# Patient Record
Sex: Female | Born: 1950 | Race: White | Hispanic: No | Marital: Single | Smoking: Former smoker
Health system: Southern US, Community
[De-identification: ages and names within clinical notes are randomized; demographics above are authoritative.]

## PROBLEM LIST (undated history)

## (undated) DIAGNOSIS — F32A Depression, unspecified: Secondary | ICD-10-CM

## (undated) DIAGNOSIS — M419 Scoliosis, unspecified: Secondary | ICD-10-CM

## (undated) DIAGNOSIS — F329 Major depressive disorder, single episode, unspecified: Secondary | ICD-10-CM

## (undated) DIAGNOSIS — J449 Chronic obstructive pulmonary disease, unspecified: Secondary | ICD-10-CM

## (undated) DIAGNOSIS — F419 Anxiety disorder, unspecified: Secondary | ICD-10-CM

## (undated) DIAGNOSIS — I10 Essential (primary) hypertension: Secondary | ICD-10-CM

---

## 2006-12-09 HISTORY — PX: TOTAL HIP ARTHROPLASTY: SHX124

## 2008-12-09 HISTORY — PX: BACK SURGERY: SHX140

## 2010-12-09 HISTORY — PX: TOTAL HIP ARTHROPLASTY: SHX124

## 2011-03-07 ENCOUNTER — Encounter (HOSPITAL_COMMUNITY)
Admission: RE | Admit: 2011-03-07 | Discharge: 2011-03-07 | Disposition: A | Discharge status: Home / Self Care | Payer: Managed Care, Other (non HMO) | Source: Ambulatory Visit | Attending: Neurological Surgery | Admitting: Neurological Surgery

## 2011-03-07 DIAGNOSIS — Z01812 Encounter for preprocedural laboratory examination: Secondary | ICD-10-CM | POA: Insufficient documentation

## 2011-03-07 DIAGNOSIS — Z01810 Encounter for preprocedural cardiovascular examination: Secondary | ICD-10-CM | POA: Insufficient documentation

## 2011-03-07 LAB — DIFFERENTIAL
Basophils Absolute: 0 10*3/uL (ref 0.0–0.1)
Basophils Relative: 0 % (ref 0–1)
Eosinophils Absolute: 0.1 10*3/uL (ref 0.0–0.7)
Monocytes Relative: 6 % (ref 3–12)
Neutro Abs: 6.5 10*3/uL (ref 1.7–7.7)
Neutrophils Relative %: 74 % (ref 43–77)

## 2011-03-07 LAB — CBC
Hemoglobin: 12.8 g/dL (ref 12.0–15.0)
MCH: 30.2 pg (ref 26.0–34.0)
Platelets: 344 10*3/uL (ref 150–400)
RBC: 4.24 MIL/uL (ref 3.87–5.11)
WBC: 8.9 10*3/uL (ref 4.0–10.5)

## 2011-03-07 LAB — BASIC METABOLIC PANEL
CO2: 30 mEq/L (ref 19–32)
Calcium: 9.6 mg/dL (ref 8.4–10.5)
Creatinine, Ser: 0.82 mg/dL (ref 0.4–1.2)
GFR calc Af Amer: >60 mL/min (ref >60)
GFR calc non Af Amer: >60 mL/min (ref >60)
Sodium: 140 mEq/L (ref 135–145)

## 2011-03-07 LAB — PROTIME-INR
INR: 0.75 (ref 0.00–1.49)
Prothrombin Time: 10.7 seconds — ABNORMAL LOW (ref 11.6–15.2)

## 2011-03-07 LAB — APTT: aPTT: 26 seconds (ref 24–37)

## 2011-03-07 LAB — SURGICAL PCR SCREEN: MRSA, PCR: NEGATIVE

## 2011-03-13 ENCOUNTER — Ambulatory Visit (HOSPITAL_COMMUNITY): Payer: Managed Care, Other (non HMO)

## 2011-03-13 ENCOUNTER — Observation Stay (HOSPITAL_COMMUNITY)
Admission: RE | Admit: 2011-03-13 | Discharge: 2011-03-14 | Disposition: A | Discharge status: Home / Self Care | Payer: Managed Care, Other (non HMO) | Source: Ambulatory Visit | Attending: Neurological Surgery | Admitting: Neurological Surgery

## 2011-03-13 DIAGNOSIS — Z01810 Encounter for preprocedural cardiovascular examination: Secondary | ICD-10-CM | POA: Insufficient documentation

## 2011-03-13 DIAGNOSIS — I10 Essential (primary) hypertension: Secondary | ICD-10-CM | POA: Insufficient documentation

## 2011-03-13 DIAGNOSIS — J4489 Other specified chronic obstructive pulmonary disease: Secondary | ICD-10-CM | POA: Insufficient documentation

## 2011-03-13 DIAGNOSIS — F17200 Nicotine dependence, unspecified, uncomplicated: Secondary | ICD-10-CM | POA: Insufficient documentation

## 2011-03-13 DIAGNOSIS — J449 Chronic obstructive pulmonary disease, unspecified: Secondary | ICD-10-CM | POA: Insufficient documentation

## 2011-03-13 DIAGNOSIS — M5126 Other intervertebral disc displacement, lumbar region: Principal | ICD-10-CM | POA: Insufficient documentation

## 2011-03-13 DIAGNOSIS — M47817 Spondylosis without myelopathy or radiculopathy, lumbosacral region: Secondary | ICD-10-CM | POA: Insufficient documentation

## 2011-03-13 DIAGNOSIS — Z01812 Encounter for preprocedural laboratory examination: Secondary | ICD-10-CM | POA: Insufficient documentation

## 2011-03-13 DIAGNOSIS — F411 Generalized anxiety disorder: Secondary | ICD-10-CM | POA: Insufficient documentation

## 2011-03-25 NOTE — Op Note (Signed)
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  POSTOPERATIVE DIAGNOSES:  Left L4-5 spondylosis with stenosis and disk herniation with left leg pain.  PROCEDURES:  Decompressive lumbar hemilaminectomy, medial facetectomy with foraminotomy at L4-5 for decompression of L5 nerve root followed by microdiskectomy utilizing microscopic dissection.  SURGEON:  Tia Alert, MD  ASSISTANT:  Donalee Citrin, MD  ANESTHESIA:  General endotracheal.  COMPLICATIONS:  None apparent.  INDICATIONS FOR PROCEDURE:  Ms. Nigh is a 60 year old female who presented with severe left leg pain and actually responded to Lyrica quite nicely, but she started to have side effects from the Lyrica. When she was off the Lyrica, she had posterior leg pain on the left with numbness and tingling.  She had an MRI which showed severe spondylosis and scoliosis, but she had severe lateral recess stenosis at L4-5 on the left.  I recommended decompressive laminectomy with possible diskectomy. She understood the risks, benefits, and expected outcome and wished to proceed.  DESCRIPTION OF PROCEDURE:  The patient was taken to the operating room and after induction of adequate generalized endotracheal anesthesia, she was rolled into prone position on the Wilson frame.  All pressure points were padded.  Her lumbar region was prepped with DuraPrep and then draped in usual sterile fashion.  A 5 mL of local anesthesia injected and a small dorsal midline incision was made and carried down to the lumbosacral fascia.  The fascia was  opened on the left side and taken down in subperiosteal fashion to expose the L4-5.  Intraoperative x-ray confirmed my level and then I used a combination of high-speed drill and Kerrison punches to perform a hemilaminectomy, medial facetectomy, and foraminotomy at L5 on the left side.  The underlying yellow ligament was opened and removed in a piecemeal fashion to expose the underlying dura and L5 nerve root.  I dissected until I was distal to the pedicle at L5 on the left, followed the nerve root out distal to the pedicle.  I undercut the lateral recess as best as I could and try to save enough facets and not destabilize the segment.  I was able to retract the nerve root medially.  He was fairly scarred down to the disk complex.  I did find a knuckle of the disk herniation that was partially calcified sticking up under the nerve.  I was able to remove this with pituitary rongeur.  I was able to incise the disk space and perform a thorough intradiscal diskectomy.  Once the diskectomy was complete, I could pass a nerve hook easily through the midline and along the L5 nerve root on the left.  I felt like it was well decompressed.  I irrigated  with saline solution containing bacitracin, dried all bleeding points, lined the dura with Gelfoam and then closed the fascia with 0 Vicryl closing subcutaneous subcuticular tissue with 2-0 and 3-0 Vicryl, and closed the skin with benzoin and Steri-Strips.  The drapes were removed.  The sterile dressing was applied.  The patient was awakened from anesthesia and transferred to recovery room in stable condition.  At the end of the procedure, all sponge, needle, and instrument counts were correct.     Tia Alert, MD     DSJ/MEDQ  D:  03/13/2011  T:  03/14/2011  Job:  [id number]  Electronically Signed by Marikay Alar MD on 03/24/2011 12:58:12 PM

## 2011-12-10 HISTORY — PX: KNEE SURGERY: SHX244

## 2015-01-20 ENCOUNTER — Other Ambulatory Visit: Payer: Self-pay | Admitting: Neurological Surgery

## 2015-02-01 ENCOUNTER — Encounter (HOSPITAL_COMMUNITY)
Admission: RE | Admit: 2015-02-01 | Discharge: 2015-02-01 | Disposition: A | Discharge status: Home / Self Care | Payer: Managed Care, Other (non HMO) | Source: Ambulatory Visit | Attending: Neurological Surgery | Admitting: Neurological Surgery

## 2015-02-01 ENCOUNTER — Encounter (HOSPITAL_COMMUNITY): Payer: Self-pay

## 2015-02-01 DIAGNOSIS — Z96643 Presence of artificial hip joint, bilateral: Secondary | ICD-10-CM | POA: Diagnosis not present

## 2015-02-01 DIAGNOSIS — M4302 Spondylolysis, cervical region: Secondary | ICD-10-CM | POA: Diagnosis not present

## 2015-02-01 DIAGNOSIS — Z87891 Personal history of nicotine dependence: Secondary | ICD-10-CM | POA: Diagnosis not present

## 2015-02-01 DIAGNOSIS — M5022 Other cervical disc displacement, mid-cervical region: Secondary | ICD-10-CM | POA: Diagnosis not present

## 2015-02-01 DIAGNOSIS — J449 Chronic obstructive pulmonary disease, unspecified: Secondary | ICD-10-CM | POA: Diagnosis not present

## 2015-02-01 DIAGNOSIS — F329 Major depressive disorder, single episode, unspecified: Secondary | ICD-10-CM | POA: Diagnosis not present

## 2015-02-01 DIAGNOSIS — M5021 Other cervical disc displacement,  high cervical region: Secondary | ICD-10-CM | POA: Diagnosis not present

## 2015-02-01 DIAGNOSIS — I10 Essential (primary) hypertension: Secondary | ICD-10-CM | POA: Diagnosis not present

## 2015-02-01 DIAGNOSIS — F419 Anxiety disorder, unspecified: Secondary | ICD-10-CM | POA: Diagnosis not present

## 2015-02-01 DIAGNOSIS — Z79899 Other long term (current) drug therapy: Secondary | ICD-10-CM | POA: Diagnosis not present

## 2015-02-01 DIAGNOSIS — M4802 Spinal stenosis, cervical region: Secondary | ICD-10-CM

## 2015-02-01 HISTORY — DX: Chronic obstructive pulmonary disease, unspecified: J44.9

## 2015-02-01 HISTORY — DX: Scoliosis, unspecified: M41.9

## 2015-02-01 HISTORY — DX: Anxiety disorder, unspecified: F41.9

## 2015-02-01 HISTORY — DX: Essential (primary) hypertension: I10

## 2015-02-01 HISTORY — DX: Major depressive disorder, single episode, unspecified: F32.9

## 2015-02-01 HISTORY — DX: Depression, unspecified: F32.A

## 2015-02-01 LAB — CBC WITH DIFFERENTIAL/PLATELET
Basophils Absolute: 0 10*3/uL (ref 0.0–0.1)
Basophils Relative: 0 % (ref 0–1)
EOS PCT: 2 % (ref 0–5)
Eosinophils Absolute: 0.2 10*3/uL (ref 0.0–0.7)
HEMATOCRIT: 41.2 % (ref 36.0–46.0)
Hemoglobin: 14 g/dL (ref 12.0–15.0)
LYMPHS PCT: 26 % (ref 12–46)
Lymphs Abs: 1.8 10*3/uL (ref 0.7–4.0)
MCH: 30.8 pg (ref 26.0–34.0)
MCHC: 34 g/dL (ref 30.0–36.0)
MCV: 90.5 fL (ref 78.0–100.0)
MONO ABS: 0.5 10*3/uL (ref 0.1–1.0)
Monocytes Relative: 7 % (ref 3–12)
Neutro Abs: 4.5 10*3/uL (ref 1.7–7.7)
Neutrophils Relative %: 65 % (ref 43–77)
PLATELETS: 287 10*3/uL (ref 150–400)
RBC: 4.55 MIL/uL (ref 3.87–5.11)
RDW: 13.6 % (ref 11.5–15.5)
WBC: 7 10*3/uL (ref 4.0–10.5)

## 2015-02-01 LAB — BASIC METABOLIC PANEL
ANION GAP: 10 (ref 5–15)
BUN: 9 mg/dL (ref 6–23)
CALCIUM: 9.9 mg/dL (ref 8.4–10.5)
CHLORIDE: 99 mmol/L (ref 96–112)
CO2: 30 mmol/L (ref 19–32)
Creatinine, Ser: 0.94 mg/dL (ref 0.50–1.10)
GFR calc Af Amer: 74 mL/min — ABNORMAL LOW (ref >90)
GFR calc non Af Amer: 64 mL/min — ABNORMAL LOW (ref >90)
GLUCOSE: 102 mg/dL — AB (ref 70–99)
POTASSIUM: 3.6 mmol/L (ref 3.5–5.1)
SODIUM: 139 mmol/L (ref 135–145)

## 2015-02-01 LAB — PROTIME-INR
INR: 0.9 (ref 0.00–1.49)
PROTHROMBIN TIME: 12.3 s (ref 11.6–15.2)

## 2015-02-01 LAB — SURGICAL PCR SCREEN
MRSA, PCR: NEGATIVE
Staphylococcus aureus: NEGATIVE

## 2015-02-01 NOTE — Progress Notes (Addendum)
PCP: Dr.Daniel Judie GrieveBryan at [facility] in [place]  [phone]

## 2015-02-01 NOTE — Pre-Procedure Instructions (Signed)
Zella RicherLinda J Stander  02/01/2015    Your procedure is scheduled on:  Wednesday, March 2  [facility] "A" at 8:00 AM.  Call this number if you have problems the morning of surgery: [phone]               Any questions prior to surgery call pre-admissions [phone] (Monday-Friday 8:00am-4:00pm)   Remember:   Do not eat food or drink liquids after midnight.   Take these medicines the morning of surgery with A SIP OF WATER: spiriva             Stop vitamins,fish oil ( Tomorrow Feb. 25)   Do not wear jewelry, make-up or nail polish.  Do not wear lotions, powders, or perfumes. You may wear deodorant.  Do not shave 48 hours prior to surgery. Men may shave face and neck.  Do not bring valuables to the hospital.  [facility] is not responsible  for any belongings or valuables.               Contacts, dentures or bridgework may not be worn into surgery.  Leave suitcase in the car. After surgery it may be brought to your room.  For patients admitted to the hospital, discharge time is determined by your                treatment team.               Patients discharged the day of surgery will not be allowed to drive  home.  Name and phone number of your driver:   Special Instructions: review preparing for surgery handout   Please read over the following fact sheets that you were given: Pain Booklet, Coughing and Deep Breathing, MRSA Information and Surgical Site Infection Prevention

## 2015-02-07 MED ORDER — CEFAZOLIN SODIUM-DEXTROSE 2-3 GM-% IV SOLR
2.0000 g | INTRAVENOUS | Status: AC
  Administered 2015-02-08 (×2): 2 g via INTRAVENOUS
  Filled 2015-02-07: qty 50

## 2015-02-07 MED ORDER — DEXAMETHASONE SODIUM PHOSPHATE 10 MG/ML IJ SOLN
10.0000 mg | INTRAMUSCULAR | Status: AC
  Administered 2015-02-08: 10 mg via INTRAVENOUS
  Filled 2015-02-07: qty 1

## 2015-02-07 NOTE — Progress Notes (Signed)
Pt. States that she is aware of time change for surgery and knows to arrive at 0630..Marland Kitchen

## 2015-02-08 ENCOUNTER — Observation Stay (HOSPITAL_COMMUNITY)
Admission: RE | Admit: 2015-02-08 | Discharge: 2015-02-09 | Disposition: A | Discharge status: Home / Self Care | Payer: Managed Care, Other (non HMO) | Source: Ambulatory Visit | Attending: Neurological Surgery | Admitting: Neurological Surgery

## 2015-02-08 ENCOUNTER — Inpatient Hospital Stay (HOSPITAL_COMMUNITY): Payer: Managed Care, Other (non HMO)

## 2015-02-08 ENCOUNTER — Encounter (HOSPITAL_COMMUNITY): Payer: Self-pay | Admitting: Neurological Surgery

## 2015-02-08 ENCOUNTER — Inpatient Hospital Stay (HOSPITAL_COMMUNITY): Payer: Managed Care, Other (non HMO) | Admitting: Certified Registered"

## 2015-02-08 ENCOUNTER — Encounter (HOSPITAL_COMMUNITY)
Admission: RE | Disposition: A | Discharge status: Home / Self Care | Payer: Self-pay | Source: Ambulatory Visit | Attending: Neurological Surgery

## 2015-02-08 DIAGNOSIS — M4302 Spondylolysis, cervical region: Secondary | ICD-10-CM | POA: Diagnosis not present

## 2015-02-08 DIAGNOSIS — J449 Chronic obstructive pulmonary disease, unspecified: Secondary | ICD-10-CM | POA: Insufficient documentation

## 2015-02-08 DIAGNOSIS — Z87891 Personal history of nicotine dependence: Secondary | ICD-10-CM | POA: Insufficient documentation

## 2015-02-08 DIAGNOSIS — M5022 Other cervical disc displacement, mid-cervical region: Secondary | ICD-10-CM | POA: Insufficient documentation

## 2015-02-08 DIAGNOSIS — Z981 Arthrodesis status: Secondary | ICD-10-CM

## 2015-02-08 DIAGNOSIS — I10 Essential (primary) hypertension: Secondary | ICD-10-CM | POA: Insufficient documentation

## 2015-02-08 DIAGNOSIS — Z79899 Other long term (current) drug therapy: Secondary | ICD-10-CM | POA: Insufficient documentation

## 2015-02-08 DIAGNOSIS — M4322 Fusion of spine, cervical region: Secondary | ICD-10-CM

## 2015-02-08 DIAGNOSIS — Z96643 Presence of artificial hip joint, bilateral: Secondary | ICD-10-CM | POA: Insufficient documentation

## 2015-02-08 DIAGNOSIS — M5021 Other cervical disc displacement,  high cervical region: Secondary | ICD-10-CM | POA: Insufficient documentation

## 2015-02-08 DIAGNOSIS — F419 Anxiety disorder, unspecified: Secondary | ICD-10-CM | POA: Insufficient documentation

## 2015-02-08 DIAGNOSIS — F329 Major depressive disorder, single episode, unspecified: Secondary | ICD-10-CM | POA: Insufficient documentation

## 2015-02-08 HISTORY — PX: ANTERIOR CERVICAL DECOMPRESSION/DISCECTOMY FUSION 4 LEVELS: SHX5556

## 2015-02-08 SURGERY — ANTERIOR CERVICAL DECOMPRESSION/DISCECTOMY FUSION 4 LEVELS
Anesthesia: General | Site: Neck

## 2015-02-08 MED ORDER — PHENYLEPHRINE HCL 10 MG/ML IJ SOLN
INTRAMUSCULAR | Status: AC
  Filled 2015-02-08: qty 1

## 2015-02-08 MED ORDER — 0.9 % SODIUM CHLORIDE (POUR BTL) OPTIME
TOPICAL | Status: DC | PRN
  Administered 2015-02-08: 1000 mL

## 2015-02-08 MED ORDER — TIOTROPIUM BROMIDE MONOHYDRATE 18 MCG IN CAPS
18.0000 ug | ORAL_CAPSULE | Freq: Every day | RESPIRATORY_TRACT | Status: DC
  Filled 2015-02-08: qty 5

## 2015-02-08 MED ORDER — THROMBIN 5000 UNITS EX SOLR
OROMUCOSAL | Status: DC | PRN
  Administered 2015-02-08 (×2): 10 mL via TOPICAL

## 2015-02-08 MED ORDER — ALBUMIN HUMAN 5 % IV SOLN
INTRAVENOUS | Status: DC | PRN
  Administered 2015-02-08: 09:00:00 via INTRAVENOUS

## 2015-02-08 MED ORDER — ROCURONIUM BROMIDE 50 MG/5ML IV SOLN
INTRAVENOUS | Status: AC
  Filled 2015-02-08: qty 1

## 2015-02-08 MED ORDER — PHENYLEPHRINE 40 MCG/ML (10ML) SYRINGE FOR IV PUSH (FOR BLOOD PRESSURE SUPPORT)
PREFILLED_SYRINGE | INTRAVENOUS | Status: AC
  Filled 2015-02-08: qty 10

## 2015-02-08 MED ORDER — BUPIVACAINE HCL (PF) 0.25 % IJ SOLN
INTRAMUSCULAR | Status: DC | PRN
  Administered 2015-02-08: 6 mL

## 2015-02-08 MED ORDER — ONDANSETRON HCL 4 MG/2ML IJ SOLN
INTRAMUSCULAR | Status: AC
  Filled 2015-02-08: qty 2

## 2015-02-08 MED ORDER — MORPHINE SULFATE 2 MG/ML IJ SOLN
1.0000 mg | INTRAMUSCULAR | Status: DC | PRN
  Administered 2015-02-09: 2 mg via INTRAVENOUS
  Filled 2015-02-08: qty 1

## 2015-02-08 MED ORDER — FENTANYL CITRATE 0.05 MG/ML IJ SOLN
INTRAMUSCULAR | Status: DC | PRN
  Administered 2015-02-08 (×4): 50 ug via INTRAVENOUS

## 2015-02-08 MED ORDER — POTASSIUM CHLORIDE IN NACL 20-0.9 MEQ/L-% IV SOLN
INTRAVENOUS | Status: DC
  Administered 2015-02-08: 16:00:00 via INTRAVENOUS
  Filled 2015-02-08 (×3): qty 1000

## 2015-02-08 MED ORDER — NEOSTIGMINE METHYLSULFATE 10 MG/10ML IV SOLN
INTRAVENOUS | Status: AC
  Filled 2015-02-08: qty 1

## 2015-02-08 MED ORDER — LIDOCAINE HCL (CARDIAC) 20 MG/ML IV SOLN
INTRAVENOUS | Status: DC | PRN
  Administered 2015-02-08: 60 mg via INTRATRACHEAL
  Administered 2015-02-08: 60 mg via INTRAVENOUS

## 2015-02-08 MED ORDER — SODIUM CHLORIDE 0.9 % IV SOLN
10.0000 mg | INTRAVENOUS | Status: DC | PRN
  Administered 2015-02-08: 30 ug/min via INTRAVENOUS

## 2015-02-08 MED ORDER — MIDAZOLAM HCL 5 MG/5ML IJ SOLN
INTRAMUSCULAR | Status: DC | PRN
  Administered 2015-02-08: 2 mg via INTRAVENOUS

## 2015-02-08 MED ORDER — BACITRACIN 50000 UNITS IM SOLR
INTRAMUSCULAR | Status: DC | PRN
  Administered 2015-02-08: 500 mL

## 2015-02-08 MED ORDER — SODIUM CHLORIDE 0.9 % IJ SOLN
INTRAMUSCULAR | Status: AC
  Filled 2015-02-08: qty 10

## 2015-02-08 MED ORDER — CEFAZOLIN SODIUM 1-5 GM-% IV SOLN
1.0000 g | Freq: Three times a day (TID) | INTRAVENOUS | Status: AC
  Administered 2015-02-08 – 2015-02-09 (×2): 1 g via INTRAVENOUS
  Filled 2015-02-08 (×2): qty 50

## 2015-02-08 MED ORDER — METHOCARBAMOL 500 MG PO TABS
500.0000 mg | ORAL_TABLET | Freq: Four times a day (QID) | ORAL | Status: DC | PRN
  Administered 2015-02-08 (×2): 500 mg via ORAL
  Filled 2015-02-08: qty 1

## 2015-02-08 MED ORDER — GLYCOPYRROLATE 0.2 MG/ML IJ SOLN
INTRAMUSCULAR | Status: AC
  Filled 2015-02-08: qty 2

## 2015-02-08 MED ORDER — NEOSTIGMINE METHYLSULFATE 10 MG/10ML IV SOLN
INTRAVENOUS | Status: DC | PRN
  Administered 2015-02-08: 3 mg via INTRAVENOUS

## 2015-02-08 MED ORDER — LISINOPRIL 40 MG PO TABS
40.0000 mg | ORAL_TABLET | Freq: Every day | ORAL | Status: DC
  Filled 2015-02-08: qty 1

## 2015-02-08 MED ORDER — ONDANSETRON HCL 4 MG/2ML IJ SOLN: 4.0000 mg | INTRAMUSCULAR | Status: DC | PRN

## 2015-02-08 MED ORDER — HYDROMORPHONE HCL 1 MG/ML IJ SOLN
0.2500 mg | INTRAMUSCULAR | Status: DC | PRN
  Administered 2015-02-08 (×4): 0.5 mg via INTRAVENOUS

## 2015-02-08 MED ORDER — PHENOL 1.4 % MT LIQD
1.0000 | OROMUCOSAL | Status: DC | PRN
  Filled 2015-02-08: qty 177

## 2015-02-08 MED ORDER — ACETAMINOPHEN 650 MG RE SUPP: 650.0000 mg | RECTAL | Status: DC | PRN

## 2015-02-08 MED ORDER — SERTRALINE HCL 100 MG PO TABS
100.0000 mg | ORAL_TABLET | Freq: Every day | ORAL | Status: DC
  Administered 2015-02-08: 100 mg via ORAL
  Filled 2015-02-08 (×2): qty 1

## 2015-02-08 MED ORDER — DEXAMETHASONE 4 MG PO TABS
4.0000 mg | ORAL_TABLET | Freq: Four times a day (QID) | ORAL | Status: DC
  Administered 2015-02-08 – 2015-02-09 (×3): 4 mg via ORAL
  Filled 2015-02-08 (×7): qty 1

## 2015-02-08 MED ORDER — SODIUM CHLORIDE 0.9 % IJ SOLN: 3.0000 mL | INTRAMUSCULAR | Status: DC | PRN

## 2015-02-08 MED ORDER — HYDROMORPHONE HCL 1 MG/ML IJ SOLN
INTRAMUSCULAR | Status: AC
  Filled 2015-02-08: qty 1

## 2015-02-08 MED ORDER — OXYCODONE-ACETAMINOPHEN 5-325 MG PO TABS
1.0000 | ORAL_TABLET | ORAL | Status: DC | PRN
  Administered 2015-02-08: 1 via ORAL
  Filled 2015-02-08: qty 1

## 2015-02-08 MED ORDER — DEXAMETHASONE SODIUM PHOSPHATE 4 MG/ML IJ SOLN
4.0000 mg | Freq: Four times a day (QID) | INTRAMUSCULAR | Status: DC
  Administered 2015-02-08: 4 mg via INTRAVENOUS
  Filled 2015-02-08 (×5): qty 1

## 2015-02-08 MED ORDER — PROPOFOL 10 MG/ML IV BOLUS
INTRAVENOUS | Status: DC | PRN
  Administered 2015-02-08: 120 mg via INTRAVENOUS

## 2015-02-08 MED ORDER — SUCCINYLCHOLINE CHLORIDE 20 MG/ML IJ SOLN
INTRAMUSCULAR | Status: AC
  Filled 2015-02-08: qty 1

## 2015-02-08 MED ORDER — CEFAZOLIN SODIUM-DEXTROSE 2-3 GM-% IV SOLR
INTRAVENOUS | Status: AC
Start: 2015-02-08 — End: 2015-02-08
  Filled 2015-02-08: qty 50

## 2015-02-08 MED ORDER — MENTHOL 3 MG MT LOZG: 1.0000 | LOZENGE | OROMUCOSAL | Status: DC | PRN

## 2015-02-08 MED ORDER — FUROSEMIDE 20 MG PO TABS
20.0000 mg | ORAL_TABLET | Freq: Every day | ORAL | Status: DC
  Filled 2015-02-08: qty 1

## 2015-02-08 MED ORDER — ACETAMINOPHEN 325 MG PO TABS: 650.0000 mg | ORAL_TABLET | ORAL | Status: DC | PRN

## 2015-02-08 MED ORDER — PROMETHAZINE HCL 25 MG/ML IJ SOLN: 6.2500 mg | INTRAMUSCULAR | Status: DC | PRN

## 2015-02-08 MED ORDER — OXYCODONE HCL 5 MG PO TABS: 5.0000 mg | ORAL_TABLET | Freq: Once | ORAL | Status: DC | PRN

## 2015-02-08 MED ORDER — MIDAZOLAM HCL 2 MG/2ML IJ SOLN
INTRAMUSCULAR | Status: AC
  Filled 2015-02-08: qty 2

## 2015-02-08 MED ORDER — EPHEDRINE SULFATE 50 MG/ML IJ SOLN
INTRAMUSCULAR | Status: DC | PRN
  Administered 2015-02-08: 10 mg via INTRAVENOUS
  Administered 2015-02-08: 15 mg via INTRAVENOUS
  Administered 2015-02-08 (×2): 10 mg via INTRAVENOUS

## 2015-02-08 MED ORDER — PROPOFOL 10 MG/ML IV BOLUS
INTRAVENOUS | Status: AC
  Filled 2015-02-08: qty 20

## 2015-02-08 MED ORDER — LIDOCAINE HCL (CARDIAC) 20 MG/ML IV SOLN
INTRAVENOUS | Status: AC
  Filled 2015-02-08: qty 5

## 2015-02-08 MED ORDER — OXYCODONE HCL 5 MG/5ML PO SOLN: 5.0000 mg | Freq: Once | ORAL | Status: DC | PRN

## 2015-02-08 MED ORDER — GLYCOPYRROLATE 0.2 MG/ML IJ SOLN
INTRAMUSCULAR | Status: DC | PRN
  Administered 2015-02-08: 0.4 mg via INTRAVENOUS

## 2015-02-08 MED ORDER — FENTANYL CITRATE 0.05 MG/ML IJ SOLN
INTRAMUSCULAR | Status: AC
  Filled 2015-02-08: qty 5

## 2015-02-08 MED ORDER — PHENYLEPHRINE HCL 10 MG/ML IJ SOLN
INTRAMUSCULAR | Status: DC | PRN
  Administered 2015-02-08 (×3): 80 ug via INTRAVENOUS

## 2015-02-08 MED ORDER — LACTATED RINGERS IV SOLN
INTRAVENOUS | Status: DC | PRN
  Administered 2015-02-08 (×2): via INTRAVENOUS

## 2015-02-08 MED ORDER — SODIUM CHLORIDE 0.9 % IJ SOLN
3.0000 mL | Freq: Two times a day (BID) | INTRAMUSCULAR | Status: DC
  Administered 2015-02-08 (×2): 3 mL via INTRAVENOUS

## 2015-02-08 MED ORDER — THROMBIN 20000 UNITS EX SOLR
CUTANEOUS | Status: DC | PRN
  Administered 2015-02-08: 20 mL via TOPICAL

## 2015-02-08 MED ORDER — METHOCARBAMOL 1000 MG/10ML IJ SOLN
500.0000 mg | Freq: Four times a day (QID) | INTRAVENOUS | Status: DC | PRN
  Filled 2015-02-08: qty 5

## 2015-02-08 MED ORDER — EPHEDRINE SULFATE 50 MG/ML IJ SOLN
INTRAMUSCULAR | Status: AC
  Filled 2015-02-08: qty 1

## 2015-02-08 MED ORDER — ROCURONIUM BROMIDE 100 MG/10ML IV SOLN
INTRAVENOUS | Status: DC | PRN
  Administered 2015-02-08: 40 mg via INTRAVENOUS

## 2015-02-08 MED ORDER — ONDANSETRON HCL 4 MG/2ML IJ SOLN
INTRAMUSCULAR | Status: DC | PRN
  Administered 2015-02-08: 4 mg via INTRAVENOUS

## 2015-02-08 SURGICAL SUPPLY — 54 items
BAG DECANTER FOR FLEXI CONT (MISCELLANEOUS) ×3 IMPLANT
BENZOIN TINCTURE PRP APPL 2/3 (GAUZE/BANDAGES/DRESSINGS) ×3 IMPLANT
BIT DRILL 13 (BIT) ×2 IMPLANT
BIT DRILL 13MM (BIT) ×1
BONE MATRIX OSTEOCEL PRO SM (Bone Implant) ×3 IMPLANT
BUR MATCHSTICK NEURO 3.0 LAGG (BURR) ×3 IMPLANT
CAGE COROENT SM 6X13X15 (Cage) ×12 IMPLANT
CANISTER SUCT 3000ML PPV (MISCELLANEOUS) ×3 IMPLANT
CLOSURE WOUND 1/2 X4 (GAUZE/BANDAGES/DRESSINGS) ×1
CONT SPEC 4OZ CLIKSEAL STRL BL (MISCELLANEOUS) ×3 IMPLANT
DRAPE C-ARM 42X72 X-RAY (DRAPES) ×6 IMPLANT
DRAPE LAPAROTOMY 100X72 PEDS (DRAPES) ×3 IMPLANT
DRAPE MICROSCOPE LEICA (MISCELLANEOUS) ×3 IMPLANT
DRAPE POUCH INSTRU U-SHP 10X18 (DRAPES) ×3 IMPLANT
DRSG OPSITE 4X5.5 SM (GAUZE/BANDAGES/DRESSINGS) ×3 IMPLANT
DRSG OPSITE POSTOP 3X4 (GAUZE/BANDAGES/DRESSINGS) ×3 IMPLANT
DRSG TELFA 3X8 NADH (GAUZE/BANDAGES/DRESSINGS) ×3 IMPLANT
DURAPREP 6ML APPLICATOR 50/CS (WOUND CARE) ×3 IMPLANT
ELECT COATED BLADE 2.86 ST (ELECTRODE) ×3 IMPLANT
ELECT REM PT RETURN 9FT ADLT (ELECTROSURGICAL) ×3
ELECTRODE REM PT RTRN 9FT ADLT (ELECTROSURGICAL) ×1 IMPLANT
GAUZE SPONGE 4X4 16PLY XRAY LF (GAUZE/BANDAGES/DRESSINGS) IMPLANT
GLOVE BIO SURGEON STRL SZ8 (GLOVE) ×6 IMPLANT
GLOVE BIOGEL PI IND STRL 7.0 (GLOVE) ×2 IMPLANT
GLOVE BIOGEL PI INDICATOR 7.0 (GLOVE) ×4
GLOVE ECLIPSE 6.5 STRL STRAW (GLOVE) ×12 IMPLANT
GOWN STRL REUS W/ TWL LRG LVL3 (GOWN DISPOSABLE) ×2 IMPLANT
GOWN STRL REUS W/ TWL XL LVL3 (GOWN DISPOSABLE) ×3 IMPLANT
GOWN STRL REUS W/TWL 2XL LVL3 (GOWN DISPOSABLE) IMPLANT
GOWN STRL REUS W/TWL LRG LVL3 (GOWN DISPOSABLE) ×4
GOWN STRL REUS W/TWL XL LVL3 (GOWN DISPOSABLE) ×6
HALTER HD/CHIN CERV TRACTION D (MISCELLANEOUS) IMPLANT
HEMOSTAT POWDER KIT SURGIFOAM (HEMOSTASIS) ×3 IMPLANT
KIT BASIN OR (CUSTOM PROCEDURE TRAY) ×3 IMPLANT
KIT ROOM TURNOVER OR (KITS) ×3 IMPLANT
NEEDLE HYPO 25X1 1.5 SAFETY (NEEDLE) ×3 IMPLANT
NEEDLE SPNL 20GX3.5 QUINCKE YW (NEEDLE) ×3 IMPLANT
NS IRRIG 1000ML POUR BTL (IV SOLUTION) ×3 IMPLANT
PACK LAMINECTOMY NEURO (CUSTOM PROCEDURE TRAY) ×3 IMPLANT
PAD ARMBOARD 7.5X6 YLW CONV (MISCELLANEOUS) ×3 IMPLANT
PLATE 4 75XNS SPNE CVD ANT T (Plate) ×1 IMPLANT
PLATE 4 ATLANTIS TRANS (Plate) ×2 IMPLANT
RUBBERBAND STERILE (MISCELLANEOUS) ×6 IMPLANT
SCREW ST 13X4XST VA NS SPNE (Screw) ×8 IMPLANT
SCREW ST VAR 4 ATL (Screw) ×16 IMPLANT
SPONGE INTESTINAL PEANUT (DISPOSABLE) ×3 IMPLANT
SPONGE SURGIFOAM ABS GEL 100 (HEMOSTASIS) ×3 IMPLANT
STRIP CLOSURE SKIN 1/2X4 (GAUZE/BANDAGES/DRESSINGS) ×2 IMPLANT
SUT VIC AB 3-0 SH 8-18 (SUTURE) ×6 IMPLANT
SYR 20ML ECCENTRIC (SYRINGE) IMPLANT
TOWEL OR 17X24 6PK STRL BLUE (TOWEL DISPOSABLE) ×3 IMPLANT
TOWEL OR 17X26 10 PK STRL BLUE (TOWEL DISPOSABLE) ×3 IMPLANT
TRAP SPECIMEN MUCOUS 40CC (MISCELLANEOUS) IMPLANT
WATER STERILE IRR 1000ML POUR (IV SOLUTION) ×3 IMPLANT

## 2015-02-08 NOTE — H&P (Signed)
Subjective:   Patient is a 64 y.o. female admitted for ACDF. The patient first presented to me with complaints of neck and arm pain. Onset of symptoms was many months ago. The pain is described as aching and occurs constantly. The pain is rated severe, and is located in the neck and radiates to the arms. The symptoms have been progressive. Symptoms are exacerbated by activity, and are relieved by nothing.  Previous work up includes MRI C-spine.  Past Medical History  Diagnosis Date  . Hypertension   . COPD (chronic obstructive pulmonary disease)     chronic bronchitis  . Depression   . Anxiety   . Scoliosis     Past Surgical History  Procedure Laterality Date  . Total hip arthroplasty Right 2008  . Back surgery  2010    lower  . Total hip arthroplasty Left 2012  . Knee surgery Right 2013    No Known Allergies  History  Substance Use Topics  . Smoking status: Former Smoker -- 0.50 packs/day for 40 years    Types: Cigarettes    Quit date: 02/01/2011  . Smokeless tobacco: Not on file  . Alcohol Use: No     Comment: recovering alcoholic for 25 yrs.    History reviewed. No pertinent family history. Prior to Admission medications   Medication Sig Start Date End Date Taking? Authorizing Provider  Acetylcysteine (N-ACETYL-L-CYSTEINE PO) Take 500 mg by mouth daily.   Yes Historical Provider, MD  B Complex Vitamins (B COMPLEX PO) Take 1 tablet by mouth daily.   Yes Historical Provider, MD  cholecalciferol (VITAMIN D) 1000 UNITS tablet Take 2,000 Units by mouth daily.   Yes Historical Provider, MD  Ferrous Sulfate Dried 45 MG TBCR Take 45 mg by mouth daily.   Yes Historical Provider, MD  furosemide (LASIX) 20 MG tablet Take 20 mg by mouth daily.   Yes Historical Provider, MD  lisinopril (PRINIVIL,ZESTRIL) 40 MG tablet Take 40 mg by mouth daily.   Yes Historical Provider, MD  Multiple Vitamins-Minerals (MULTIVITAMIN WITH MINERALS) tablet Take 1 tablet by mouth daily.   Yes Historical  Provider, MD  Omega-3 Fatty Acids (FISH OIL) 1200 MG CAPS Take 1,200 mg by mouth daily.   Yes Historical Provider, MD  potassium chloride (K-DUR) 10 MEQ tablet Take 10 mEq by mouth daily.   Yes Historical Provider, MD  sertraline (ZOLOFT) 100 MG tablet Take 100 mg by mouth daily.   Yes Historical Provider, MD  tiotropium (SPIRIVA) 18 MCG inhalation capsule Place 18 mcg into inhaler and inhale daily.   Yes Historical Provider, MD  zolpidem (AMBIEN) 10 MG tablet Take 10 mg by mouth at bedtime as needed for sleep.   Yes Historical Provider, MD     Review of Systems  Positive ROS: neg except back pain  All other systems have been reviewed and were otherwise negative with the exception of those mentioned in the HPI and as above.  Objective: Vital signs in last 24 hours:    General Appearance: Alert, cooperative, no distress, appears stated age Head: Normocephalic, without obvious abnormality, atraumatic Eyes: PERRL, conjunctiva/corneas clear, EOM's intact      Neck: Supple, symmetrical, trachea midline, Back: Symmetric, no curvature, ROM normal, no CVA tenderness Lungs:  respirations unlabored Heart: Regular rate and rhythm Abdomen: Soft, non-tender Extremities: Extremities normal, atraumatic, no cyanosis or edema Pulses: 2+ and symmetric all extremities Skin: Skin color, texture, turgor normal, no rashes or lesions  NEUROLOGIC:  Mental status: Alert and oriented x4,  no aphasia, good attention span, fund of knowledge and memory  Motor Exam - grossly normal Sensory Exam - grossly normal Reflexes: trace Coordination - grossly normal Gait - grossly normal Balance - grossly normal Cranial Nerves: I: smell Not tested  II: visual acuity  OS: nl    OD: nl  II: visual fields Full to confrontation  II: pupils Equal, round, reactive to light  III,VII: ptosis None  III,IV,VI: extraocular muscles  Full ROM  V: mastication Normal  V: facial light touch sensation  Normal  V,VII: corneal  reflex  Present  VII: facial muscle function - upper  Normal  VII: facial muscle function - lower Normal  VIII: hearing Not tested  IX: soft palate elevation  Normal  IX,X: gag reflex Present  XI: trapezius strength  5/5  XI: sternocleidomastoid strength 5/5  XI: neck flexion strength  5/5  XII: tongue strength  Normal    Data Review Lab Results  Component Value Date   WBC 7.0 02/01/2015   HGB 14.0 02/01/2015   HCT 41.2 02/01/2015   MCV 90.5 02/01/2015   PLT 287 02/01/2015   Lab Results  Component Value Date   NA 139 02/01/2015   K 3.6 02/01/2015   CL 99 02/01/2015   CO2 30 02/01/2015   BUN 9 02/01/2015   CREATININE 0.94 02/01/2015   GLUCOSE 102* 02/01/2015   Lab Results  Component Value Date   INR 0.90 02/01/2015    Assessment:   Cervical neck pain with herniated nucleus pulposus/ spondylosis/ stenosis at C3-C7. Patient has failed conservative therapy. Planned surgery : ACDF with plate Z6-1, W9-6, C5-6, C6-7  Plan:   I explained the condition and procedure to the patient and answered any questions.  Patient wishes to proceed with procedure as planned. Understands risks/ benefits/ and expected or typical outcomes.  Angelize Ryce S 02/08/2015 6:16 AM

## 2015-02-08 NOTE — Anesthesia Postprocedure Evaluation (Signed)
  Anesthesia Post-op Note  Patient: Zella RicherLinda J Kehoe  Procedure(s) Performed: Procedure(s) with comments: ANTERIOR CERVICAL DECOMPRESSION/DISCECTOMY FUSION 4 LEVELS (N/A) - ANTERIOR CERVICAL DECOMPRESSION/DISCECTOMY FUSION 4 LEVELS CERVICAL 3-7  Patient Location: PACU  Anesthesia Type:General  Level of Consciousness: awake and alert   Airway and Oxygen Therapy: Patient Spontanous Breathing  Post-op Pain: none  Post-op Assessment: Post-op Vital signs reviewed  Post-op Vital Signs: Reviewed  Last Vitals:  Filed Vitals:   02/08/15 1425  BP: 105/58  Pulse: 75  Temp: 36.6 C  Resp: 20    Complications: No apparent anesthesia complications

## 2015-02-08 NOTE — Anesthesia Procedure Notes (Signed)
Procedure Name: Intubation Date/Time: 02/08/2015 8:35 AM Performed by: Julian Reil Pre-anesthesia Checklist: Patient identified, Emergency Drugs available, Suction available and Patient being monitored Patient Re-evaluated:Patient Re-evaluated prior to inductionOxygen Delivery Method: Circle system utilized Preoxygenation: Pre-oxygenation with 100% oxygen Intubation Type: IV induction Ventilation: Mask ventilation without difficulty Laryngoscope Size: Mac and 3 Grade View: Grade I Tube type: Oral Tube size: 7.0 mm Number of attempts: 1 Airway Equipment and Method: Stylet and LTA kit utilized Placement Confirmation: ETT inserted through vocal cords under direct vision,  positive ETCO2 and breath sounds checked- equal and bilateral Secured at: 21 cm Tube secured with: Tape Dental Injury: Teeth and Oropharynx as per pre-operative assessment

## 2015-02-08 NOTE — Progress Notes (Signed)
Report given to emily rn as caregiver 

## 2015-02-08 NOTE — Plan of Care (Signed)
Problem: Consults Goal: Diagnosis - Spinal Surgery Outcome: Completed/Met Date Met:  02/08/15 Cervical Spine Fusion     

## 2015-02-08 NOTE — Transfer of Care (Signed)
Immediate Anesthesia Transfer of Care Note  Patient: Monica RicherLinda J Savage  Procedure(s) Performed: Procedure(s) with comments: ANTERIOR CERVICAL DECOMPRESSION/DISCECTOMY FUSION 4 LEVELS (N/A) - ANTERIOR CERVICAL DECOMPRESSION/DISCECTOMY FUSION 4 LEVELS CERVICAL 3-7  Patient Location: PACU  Anesthesia Type:General  Level of Consciousness: awake, alert , oriented and patient cooperative  Airway & Oxygen Therapy: Patient Spontanous Breathing and Patient connected to nasal cannula oxygen  Post-op Assessment: Report given to RN, Post -op Vital signs reviewed and stable and Patient moving all extremities  Post vital signs: Reviewed and stable  Last Vitals:  Filed Vitals:   02/08/15 1245  BP: 105/51  Pulse: 85  Temp:   Resp: 21    Complications: No apparent anesthesia complications

## 2015-02-08 NOTE — Op Note (Signed)
02/08/2015  12:39 PM  PATIENT:  Monica RicherLinda J Peretz  64 y.o. 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 Decompressive anterior cervical discectomy C3-4, C4-5, C5-6, C6-7, 2. Anterior cervical arthrodesis C3-4, C4-5, C5-6, C6-7 utilizing peek interbody cages packed with local autograft and morcellized allograft, 3. Anterior cervical plating utilizing a Atlantis vision plate  SURGEON:  Marikay Alaravid Travious Vanover, MD  ASSISTANTS: Dr. Wynetta Emeryram  ANESTHESIA:   General  EBL: 200 ml  Total I/O In: 1900 [I.V.:1650; IV Piggyback:250] Out: 610 [Urine:410; Blood:200]  BLOOD ADMINISTERED:none  DRAINS: 7 flat JP   SPECIMEN:  No Specimen  INDICATION FOR PROCEDURE: This patient present with a long history of neck and bilateral arm pain. MRI showed spinal deformity at C3-4 C4-5 C5-6 and C6-7 with spondylosis. There is stenosis at C5-6 and varying degrees of central and foraminal stenosis at the other levels. Subluxation at C6-7. Medical management without relief. I recommended ACDF with plating at C3-4, C4-5, C5-6, and C6-7. Patient understood the risks, benefits, and alternatives and potential outcomes and wished to proceed.  PROCEDURE DETAILS: Patient was brought to the operating room placed under general endotracheal anesthesia. Patient was placed in the supine position on the operating room table. The neck was prepped with Duraprep and draped in a sterile fashion.   Three cc of local anesthesia was injected and a transverse incision was made on the right side of the neck.  Dissection was carried down thru the subcutaneous tissue and the platysma was  elevated, opened, and undermined with Metzenbaum scissors.  Dissection was then carried out thru an avascular plane leaving the sternocleidomastoid carotid artery and jugular vein laterally and the trachea and esophagus medially. The ventral aspect of the vertebral  column was identified and a localizing x-ray was taken. The C5-6 level was identified. The longus colli muscles were then elevated and the retractor was placed to expose C3-C7. The annulus was incised at each level and the disc space entered. The disc was quite collapsed at each level and the initial exposure was performed by drilling the disc with the high-speed drill in order to widen the disc space. Discectomy was performed with micro-curettes and pituitary rongeurs. I then used the high-speed drill to drill the endplates down to the level of the posterior longitudinal ligament. The drill shavings were saved in a mucous trap for later arthrodesis. The operating microscope was draped and brought into the field provided additional magnification, illumination and visualization. Discectomy was continued posteriorly thru the disc space. Posterior longitudinal ligament was opened with a nerve hook at each level, and then removed along with disc herniation and osteophytes, decompressing the spinal canal and thecal sac at each level. C5-6 had the most significant central stenosis. We then continued to remove osteophytic overgrowth and disc material decompressing the neural foramina and exiting nerve roots bilaterally at each level. The scope was angled up and down to help decompress and undercut the vertebral bodies. Once the decompression was completed we could pass a nerve hook circumferentially to assure adequate decompression in the midline and in the neural foramina. So by both visualization and palpation we felt we had an adequate decompression of the neural elements. We then measured the height of the intravertebral disc space and selected a 6 millimeter Peek interbody cage packed with autograft and morcellized allograft. It was then gently positioned in the intravertebral disc space and countersunk. I then used  a Atlantis vision plate and placed variable angle screws into the vertebral bodies of C3-C7 inclusive  and locked them into position. The wound was irrigated with bacitracin solution, checked for hemostasis which was established and confirmed. Once meticulous hemostasis was achieved, we then proceeded with closure. The platysma was closed with interrupted 3-0 undyed Vicryl suture, the subcuticular layer was closed with interrupted 3-0 undyed Vicryl suture. The skin edges were approximated with steristrips. The drapes were removed. A sterile dressing was applied. The patient was then awakened from general anesthesia and transferred to the recovery room in stable condition. At the end of the procedure all sponge, needle and instrument counts were correct.   PLAN OF CARE: Admit for overnight observation  PATIENT DISPOSITION:  PACU - hemodynamically stable.   Delay start of Pharmacological VTE agent (>24hrs) due to surgical blood loss or risk of bleeding:  yes

## 2015-02-08 NOTE — Anesthesia Preprocedure Evaluation (Addendum)
Anesthesia Evaluation  Patient identified by MRN, date of birth, ID band Patient awake    Reviewed: Allergy & Precautions, NPO status , Patient's Chart, lab work & pertinent test results  Airway Mallampati: I  TM Distance: >3 FB Neck ROM: Full    Dental  (+) Poor Dentition, Dental Advisory Given   Pulmonary COPDformer smoker,          Cardiovascular hypertension, Pt. on medications Rhythm:Regular     Neuro/Psych Anxiety Depression    GI/Hepatic negative GI ROS, Neg liver ROS,   Endo/Other  negative endocrine ROS  Renal/GU negative Renal ROS     Musculoskeletal   Abdominal   Peds  Hematology negative hematology ROS (+)   Anesthesia Other Findings   Reproductive/Obstetrics                           Anesthesia Physical Anesthesia Plan  ASA: II  Anesthesia Plan: General   Post-op Pain Management:    Induction: Intravenous  Airway Management Planned: Oral ETT  Additional Equipment:   Intra-op Plan:   Post-operative Plan: Extubation in OR  Informed Consent: I have reviewed the patients History and Physical, chart, labs and discussed the procedure including the risks, benefits and alternatives for the proposed anesthesia with the patient or authorized representative who has indicated his/her understanding and acceptance.   Dental advisory given  Plan Discussed with: CRNA  Anesthesia Plan Comments:         Anesthesia Quick Evaluation

## 2015-02-09 ENCOUNTER — Encounter (HOSPITAL_COMMUNITY): Payer: Self-pay | Admitting: Neurological Surgery

## 2015-02-09 DIAGNOSIS — M4302 Spondylolysis, cervical region: Secondary | ICD-10-CM | POA: Diagnosis not present

## 2015-02-09 MED ORDER — METHOCARBAMOL 500 MG PO TABS: 500.0000 mg | ORAL_TABLET | Freq: Four times a day (QID) | ORAL | Status: AC | PRN

## 2015-02-09 MED ORDER — OXYCODONE-ACETAMINOPHEN 5-325 MG PO TABS: 1.0000 | ORAL_TABLET | Freq: Four times a day (QID) | ORAL | Status: AC | PRN

## 2015-02-09 NOTE — Discharge Summary (Signed)
Physician Discharge Summary  Patient ID: Monica Savage MRN: [id number] DOB/AGE: January 01, 1951 64 y.o.  Admit date: 02/08/2015 Discharge date: 02/09/2015  Admission Diagnoses: cervical stenosis    Discharge Diagnoses: same   Discharged Condition: good  Hospital Course: The patient was admitted on 02/08/2015 and taken to the operating room where the patient underwent ACDF C34, c45, c5-6, C6-7. The patient tolerated the procedure well and was taken to the recovery room and then to the floor in stable condition. The hospital course was routine. There were no complications. The wound remained clean dry and intact. Pt had appropriate neck soreness. No complaints of arm pain or new N/T/W. The patient remained afebrile with stable vital signs, and tolerated a regular diet. The patient continued to increase activities, and pain was well controlled with oral pain medications.   Consults: None  Significant Diagnostic Studies:  Results for orders placed or performed during the hospital encounter of 02/01/15  Surgical pcr screen  Result Value Ref Range   MRSA, PCR NEGATIVE NEGATIVE   Staphylococcus aureus NEGATIVE NEGATIVE  Basic metabolic panel  Result Value Ref Range   Sodium 139 135 - 145 mmol/L   Potassium 3.6 3.5 - 5.1 mmol/L   Chloride 99 96 - 112 mmol/L   CO2 30 19 - 32 mmol/L   Glucose, Bld 102 (H) 70 - 99 mg/dL   BUN 9 6 - 23 mg/dL   Creatinine, Ser 4.090.94 0.50 - 1.10 mg/dL   Calcium 9.9 8.4 - 81.110.5 mg/dL   GFR calc non Af Amer 64 (L) >90 mL/min   GFR calc Af Amer 74 (L) >90 mL/min   Anion gap 10 5 - 15  CBC WITH DIFFERENTIAL  Result Value Ref Range   WBC 7.0 4.0 - 10.5 K/uL   RBC 4.55 3.87 - 5.11 MIL/uL   Hemoglobin 14.0 12.0 - 15.0 g/dL   HCT 91.441.2 78.236.0 - 95.646.0 %   MCV 90.5 78.0 - 100.0 fL   MCH 30.8 26.0 - 34.0 pg   MCHC 34.0 30.0 - 36.0 g/dL   RDW 21.313.6 08.611.5 - 57.815.5 %   Platelets 287 150 - 400 K/uL   Neutrophils Relative % 65 43 - 77 %   Neutro Abs 4.5 1.7 - 7.7 K/uL    Lymphocytes Relative 26 12 - 46 %   Lymphs Abs 1.8 0.7 - 4.0 K/uL   Monocytes Relative 7 3 - 12 %   Monocytes Absolute 0.5 0.1 - 1.0 K/uL   Eosinophils Relative 2 0 - 5 %   Eosinophils Absolute 0.2 0.0 - 0.7 K/uL   Basophils Relative 0 0 - 1 %   Basophils Absolute 0.0 0.0 - 0.1 K/uL  Protime-INR  Result Value Ref Range   Prothrombin Time 12.3 11.6 - 15.2 seconds   INR 0.90 0.00 - 1.49    Chest 2 View  02/01/2015   CLINICAL DATA:  Preoperative evaluation for cervical decompression  EXAM: CHEST  2 VIEW  COMPARISON:  None.  FINDINGS: Cardiac shadow is within normal limits. A small hiatal hernia is noted. The lungs are hyperinflated consistent with COPD. Bilateral nipple shadows are noted. No acute bony abnormality is seen.  IMPRESSION: COPD without acute abnormality.  Hiatal hernia.   Electronically Signed   By: Alcide CleverMark  Lukens M.D.   On: 02/01/2015 16:59   Dg Cervical Spine 2-3 Views  02/08/2015   CLINICAL DATA:  C3-C7 ACDF.  EXAM: DG C-ARM 61-120 MIN; CERVICAL SPINE - 2-3 VIEW  FLUOROSCOPY TIME:  Radiation  Exposure Index (as provided by the fluoroscopic device):  If the device does not provide the exposure index:  Fluoroscopy Time (in minutes and seconds):  0 minutes 45 seconds  Number of Acquired Images:  3  COMPARISON:  MRI 01/05/2015  FINDINGS: Examination demonstrates anterior fusion hardware with vertebral body screws from C3-C7 intact and normally located. Intervertebral disc spacers are present at the intervening disc spaces. Vertebral body alignment and heights are within normal. Prevertebral soft tissues are unremarkable.  IMPRESSION: Anterior fusion hardware from C3-C7 intact and normally located.   Electronically Signed   By: Elberta Fortis M.D.   On: 02/08/2015 12:23   Dg C-arm 1-60 Min  02/08/2015   CLINICAL DATA:  C3-C7 ACDF.  EXAM: DG C-ARM 61-120 MIN; CERVICAL SPINE - 2-3 VIEW  FLUOROSCOPY TIME:  Radiation Exposure Index (as provided by the fluoroscopic device):  If the device does not  provide the exposure index:  Fluoroscopy Time (in minutes and seconds):  0 minutes 45 seconds  Number of Acquired Images:  3  COMPARISON:  MRI 01/05/2015  FINDINGS: Examination demonstrates anterior fusion hardware with vertebral body screws from C3-C7 intact and normally located. Intervertebral disc spacers are present at the intervening disc spaces. Vertebral body alignment and heights are within normal. Prevertebral soft tissues are unremarkable.  IMPRESSION: Anterior fusion hardware from C3-C7 intact and normally located.   Electronically Signed   By: Elberta Fortis M.D.   On: 02/08/2015 12:23    Antibiotics:  Anti-infectives    Start     Dose/Rate Route Frequency Ordered Stop   02/08/15 2000  ceFAZolin (ANCEF) IVPB 1 g/50 mL premix     1 g 100 mL/hr over 30 Minutes Intravenous Every 8 hours 02/08/15 1431 02/09/15 0353   02/08/15 0907  bacitracin 50,000 Units in sodium chloride irrigation 0.9 % 500 mL irrigation  Status:  Discontinued       As needed 02/08/15 0908 02/08/15 1233   02/08/15 0600  ceFAZolin (ANCEF) IVPB 2 g/50 mL premix     2 g 100 mL/hr over 30 Minutes Intravenous On call to O.R. 02/07/15 1254 02/08/15 1218      Discharge Exam: Blood pressure 108/58, pulse 70, temperature 98 F (36.7 C), temperature source Oral, resp. rate 18, weight 150 lb (68.04 kg), SpO2 97 %. Neurologic: Grossly normal Incision CDI  Discharge Medications:     Medication List    TAKE these medications        B COMPLEX PO  Take 1 tablet by mouth daily.     cholecalciferol 1000 UNITS tablet  Commonly known as:  VITAMIN D  Take 2,000 Units by mouth daily.     Ferrous Sulfate Dried 45 MG Tbcr  Take 45 mg by mouth daily.     Fish Oil 1200 MG Caps  Take 1,200 mg by mouth daily.     furosemide 20 MG tablet  Commonly known as:  LASIX  Take 20 mg by mouth daily.     lisinopril 40 MG tablet  Commonly known as:  PRINIVIL,ZESTRIL  Take 40 mg by mouth daily.     methocarbamol 500 MG tablet   Commonly known as:  ROBAXIN  Take 1 tablet (500 mg total) by mouth every 6 (six) hours as needed for muscle spasms.     multivitamin with minerals tablet  Take 1 tablet by mouth daily.     N-ACETYL-L-CYSTEINE PO  Take 500 mg by mouth daily.     oxyCODONE-acetaminophen 5-325 MG per tablet  Commonly known as:  PERCOCET/ROXICET  Take 1-2 tablets by mouth every 6 (six) hours as needed for moderate pain.     potassium chloride 10 MEQ tablet  Commonly known as:  K-DUR  Take 10 mEq by mouth daily.     sertraline 100 MG tablet  Commonly known as:  ZOLOFT  Take 100 mg by mouth daily.     tiotropium 18 MCG inhalation capsule  Commonly known as:  SPIRIVA  Place 18 mcg into inhaler and inhale daily.     zolpidem 10 MG tablet  Commonly known as:  AMBIEN  Take 10 mg by mouth at bedtime as needed for sleep.        Disposition: home   Final Dx:  4- level ACDF      Discharge Instructions    Call MD for:  difficulty breathing, headache or visual disturbances    Complete by:  As directed      Call MD for:  persistant nausea and vomiting    Complete by:  As directed      Call MD for:  redness, tenderness, or signs of infection (pain, swelling, redness, odor or green/yellow discharge around incision site)    Complete by:  As directed      Call MD for:  severe uncontrolled pain    Complete by:  As directed      Call MD for:  temperature >100.4    Complete by:  As directed      Diet - low sodium heart healthy    Complete by:  As directed      Discharge instructions    Complete by:  As directed   No driving, no heavy lifting or strenuous activity, may shower     Increase activity slowly    Complete by:  As directed               Signed: Jamesa Tedrick S 02/09/2015, 7:28 AM

## 2015-02-09 NOTE — Discharge Instructions (Signed)
Wound Care °Keep incision covered and dry for one week.  If you shower prior to then, cover incision with plastic wrap.  °You may remove outer bandage after one week and shower.  °Do not put any creams, lotions, or ointments on incision. °Leave steri-strips on neck.  They will fall off by themselves. °Activity °Walk each and every day, increasing distance each day. °No lifting greater than 5 lbs.  Avoid excessive neck motion. °No driving for 2 weeks; may ride as a passenger locally. °Wear neck brace at all times except when showering or otherwise instructed. °Diet °Resume your normal diet.  °Return to Work °Will be discussed at you follow up appointment. °Call Your Doctor If Any of These Occur °Redness, drainage, or swelling at the wound.  °Temperature greater than 101 degrees. °Severe pain not relieved by pain medication. °Increased difficulty swallowing.  °Incision starts to come apart. °Follow Up Appt °Call today for appointment in 1-2 weeks ([phone]) or for problems.  If you have any hardware placed in your spine, you will need an x-ray before your appointment. ° °Anterior Cervical Diskectomy and Fusion °Anterior cervical diskectomy is surgery done on the upper spine to relieve pressure on one or more nerve roots, or on the spinal cord. There are 7 bones in your neck, called the cervical spine. These 7 bones (vertebrae) sit one on top of the other. Cushions (intervertebral disks) separate the vertebrae and act like shock absorbers. As we age, degeneration of our bones, joints, and disks can cause neck pain and tightening around the spinal cord and nerve roots. This causes arm pain and weakness.  °Degeneration involves: °· Herniated Disk. With age, the disks dry up and can rupture. In this condition, the center of the disk bulges out (disk herniation). This can cause pressure on a nerve, which produces pain or weakness in the arm. °· Bone spurs and spinal stenosis. As we age, growths often develop on our bones.  These growths are called bone spurs (osteophytes). A bone spur is a collection of calcium. As bone spurs grow and extend, the vertebral openings become narrow. The spinal canal and/or the foramen (opening for nerve passageways) become smaller. This narrowing (stenosis) may cause pinching (compression) of the spinal cord or the spinal nerve root. The nerve injury can cause pain, weakness, numbness, and loss of coordination in the upper limbs. Often, patients have difficulty with their hand writing or they start dropping things, because their hand grip is weaker. The spinal cord damage can cause increased stiffness, more frequent falls, electric shooting pain, and changes in bowel and bladder control. °Degeneration in the neck results in three common problems: °· Radiculopathy - Nerve compression that results in weakness or pain that radiates down the arm. °· Myelopathy - Spinal cord compression that causes stiffness, difficulty with walking, coordination, and trouble with bowel or bladder habits. °· Neck pain - Worn out joints cause pain as the neck moves. °Treatment: °· Radiculopathy - Surgery is performed to remove the bony and disk material that is pushing on the nerve. °· Myelopathy - Surgery is performed to remove the bony and disk material that pushes on the spinal cord. °· Neck pain - Surgery is performed to combine (fuse) the joints of the neck together, so they cannot move or cause pain. °Surgery can be done from the front or the back of the neck. When it is done from the front, it is called an anterior (front) cervical (neck) diskectomy (removal of the disk) and fusion. °  LET YOUR CAREGIVER KNOW ABOUT:  °· Recent infections. °· Any shooting pains down your leg, when you move your neck. °· Any difficulty swallowing. °· A smoking history. °· Use of blood thinners or anti-inflammatory medicines. °· Any history of injury to your shoulders. °· Any history of injury to your vocal cords. °· Any foreign objects in  your body from a previous surgery. °· Any recent fevers or illness. °· Past medical history (diabetes, strokes). °· Past problems with anesthetics. °· Possibility of pregnancy. °· History of blood clots (deep vein thrombosis). °· History of bleeding or blood problems. °· Past surgeries. °· Other health problems. °· Allergies. °· Medicines you take, including herbs, eye drops, over-the-counter medicines, and creams. °· Use of steroids (by mouth or creams). °RISKS AND COMPLICATIONS °· Infection. °· Bleeding. °· Injury to the following structures: °¨ Carotid artery. This can result in a stroke or significant amount of bleeding. °¨ Esophagus, resulting in difficulty swallowing. °¨ Recurrent laryngeal nerve, resulting in hoarseness of the voice. °¨ Spinal cord injury, ranging from mild to complete quadriparesis (muscle weakness in all four limbs). °¨ Nerve root injury, resulting in muscle weakness in the upper limb. °¨ Leakage of cerebrospinal fluid. °BEFORE THE PROCEDURE  °· You will be given medicine to help you sleep (general anesthetic), and a breathing tube will be placed. °· You will be given antibiotics to keep the infection rate down. °· The incision site on your neck will be marked. °· Your neck will be cleaned, to reduce the risk of infection. °PROCEDURE  °An anterior cervical fusion means that the operation is done through the front (anterior) part of your neck. The cut made by the surgeon (incision) is usually within a skin fold line on the neck. After pushing aside the neck muscles, the surgeon removes the affected, degenerated disk and bone spurs (osteophytes), which takes the pressure off the nerves and spinal cord. This is called a decompression. The area where the disk was removed is then filled with a small piece of plastic. This plastic takes the place of the disk and keeps the nerve passageway (foramen) open and clear for the nerves. In most cases, the surgeon uses metal plates or pins (hardware) in  the neck, to help stabilize the level being fused. The hardware reduces motion at that level, so it can fuse. This provides extra support to the neck. A cervical fusion procedure takes anywhere from a couple to several hours, depending on the size of the neck, history of previous surgery, and number of levels being fused. °AFTER THE PROCEDURE  °· You will likely spend 24-48 hours in the hospital. During this time, your caregivers will look for any signs of complications from the procedure. °· Your caregiver will watch you, to make sure that fluid draining from the surgery slows down. It is important that a large mass of blood does not form in your neck, which would cause difficulty with breathing. °· You will get 24 hours of antibiotics. °· You can start to eat as soon as you feel comfortable. °· Once you have started eating, walking, urinating (voiding) and having bowel movements on your own, your caregiver will discharge you home. °HOME CARE INSTRUCTIONS  °· For 2 weeks, do not soak the incision site under water. Do not swim or take baths. Showers are okay, but rinse off the incision sites. °· Do not over exert yourself. Allow time for the incision to heal. °· It can take from 6 weeks to 6   months for fusion to take effect. Your caregiver may ask you to wear a neck collar during this time, as they check the fusion with multiple (serial) X-rays. °Document Released: 11/13/2009 Document Revised: 03/22/2013 Document Reviewed: 11/13/2009 °Exit[employer]® Patient Information ©2015 [employer]. This information is not intended to replace advice given to you by your health care provider. Make sure you discuss any questions you have with your health care provider. ° °

## 2015-02-09 NOTE — Progress Notes (Signed)
Pt given D/C instructions with Rx's, verbal understanding was provided. Pt's incision is covered with gauze dressing and is clean and dry with no sign of infection. Pt's IV was removed prior to D/C. Pt D/C'd home via wheelchair @ 0900 per MD order. Pt is stable @ D/C and has no other needs at this time. Rema FendtAshley Yukiko Minnich, RN

## 2015-03-28 ENCOUNTER — Other Ambulatory Visit: Payer: Self-pay | Admitting: Neurological Surgery

## 2015-03-28 ENCOUNTER — Ambulatory Visit (INDEPENDENT_AMBULATORY_CARE_PROVIDER_SITE_OTHER): Payer: Managed Care, Other (non HMO)

## 2015-03-28 DIAGNOSIS — Z981 Arthrodesis status: Secondary | ICD-10-CM

## 2015-03-28 DIAGNOSIS — M542 Cervicalgia: Secondary | ICD-10-CM

## 2015-05-23 ENCOUNTER — Ambulatory Visit (INDEPENDENT_AMBULATORY_CARE_PROVIDER_SITE_OTHER): Payer: Managed Care, Other (non HMO)

## 2015-05-23 ENCOUNTER — Other Ambulatory Visit: Payer: Self-pay | Admitting: Neurological Surgery

## 2015-05-23 DIAGNOSIS — M4322 Fusion of spine, cervical region: Secondary | ICD-10-CM

## 2015-05-23 DIAGNOSIS — M4802 Spinal stenosis, cervical region: Secondary | ICD-10-CM

## 2015-06-15 ENCOUNTER — Ambulatory Visit: Payer: Managed Care, Other (non HMO) | Attending: Neurological Surgery | Admitting: Physical Therapy

## 2015-06-15 DIAGNOSIS — H8111 Benign paroxysmal vertigo, right ear: Secondary | ICD-10-CM | POA: Diagnosis not present

## 2015-06-15 NOTE — Therapy (Signed)
[facility] [place] [address]  [address] [place], [place], [address] Phone: [phone]   Fax:  [phone]  Physical Therapy Evaluation  Patient Details  Name: Monica RicherLinda J Savage MRN: [id number] Date of Birth: 27-May-1951 Referring Provider:  Tia AlertJones, David S, MD  Encounter Date: 06/15/2015      PT End of Session - 06/15/15 1519    Visit Number 1   Number of Visits 4   Date for PT Re-Evaluation 07/13/15   PT Start Time 1440   PT Stop Time 1515   PT Time Calculation (min) 35 min   Activity Tolerance Patient tolerated treatment well   Behavior During Therapy [facility] for tasks assessed/performed      Past Medical History  Diagnosis Date  . Hypertension   . COPD (chronic obstructive pulmonary disease)     chronic bronchitis  . Depression   . Anxiety   . Scoliosis     Past Surgical History  Procedure Laterality Date  . Total hip arthroplasty Right 2008  . Back surgery  2010    lower  . Total hip arthroplasty Left 2012  . Knee surgery Right 2013  . Anterior cervical decompression/discectomy fusion 4 levels N/A 02/08/2015    Procedure: ANTERIOR CERVICAL DECOMPRESSION/DISCECTOMY FUSION 4 LEVELS;  Surgeon: Tia Alertavid S Jones, MD;  Location: [facility];  Service: Neurosurgery;  Laterality: N/A;  ANTERIOR CERVICAL DECOMPRESSION/DISCECTOMY FUSION 4 LEVELS CERVICAL 3-7    There were no vitals filed for this visit.  Visit Diagnosis:  Benign paroxysmal positional vertigo, right - Plan: PT plan of care cert/re-cert      Subjective Assessment - 06/15/15 1442    Subjective Pt is a 64 y/o who presents to OPPT with vertigo since cervical fusion.  Pt reports symptoms most severe when lying on right side.   Pertinent History cervical fusion 02/08/15   Patient Stated Goals eliminate dizziness   Currently in Pain? Yes   Pain Score 4    Pain Location Neck   Pain Orientation Anterior   Pain Descriptors / Indicators Other (Comment)  "stinging"   Pain Onset 1 to 4 weeks ago   Pain Frequency Occasional   Aggravating Factors  none   Pain Relieving Factors pt reports very quick and short lived            [facility] - 06/15/15 1445    Assessment   Medical Diagnosis BPPV   Onset Date/Surgical Date 02/08/15   Next MD Visit PRN   Prior Therapy none   Restrictions   Weight Bearing Restrictions No   Balance Screen   Has the patient fallen in the past 6 months Yes   How many times? 4-result of dizziness   Has the patient had a decrease in activity level because of a fear of falling?  Yes   Is the patient reluctant to leave their home because of a fear of falling?  Yes   Observation/Other Assessments   Focus on Therapeutic Outcomes (FOTO)  56 (44% limited; predicted 25% limited)            Vestibular Assessment - 06/15/15 1448    Symptom Behavior   Type of Dizziness Spinning   Frequency of Dizziness every time lying down on R side   Duration of Dizziness 1 min   Aggravating Factors Rolling to left;Rolling to right  lying onto R side   Relieving Factors Rest   Occulomotor Exam   Smooth Pursuits Intact   Saccades  Intact   Vestibulo-Occular Reflex   VOR 1 Head Only (x 1 viewing) WNL   Positional Testing   Dix-Hallpike Dix-Hallpike Right;Dix-Hallpike Left   Dix-Hallpike Right   Dix-Hallpike Right Duration 10-15 sec   Dix-Hallpike Right Symptoms Upbeat, right rotatory nystagmus   Dix-Hallpike Left   Dix-Hallpike Left Duration 15-20 sec; mild nystagmus   Dix-Hallpike Left Symptoms Downbeat, right rotatory nystagmus                Vestibular Treatment/Exercise - 06/15/15 1518    Vestibular Treatment/Exercise   Vestibular Treatment Provided Canalith Repositioning   Canalith Repositioning Epley Manuever Right    EPLEY MANUEVER RIGHT   Number of Reps  2   Overall Response Improved Symptoms   Response Details  pt reported min symptoms on 2nd rep; no nystagmus noted               PT  Education - 06/15/15 1519    Education provided Yes   Education Details BPPV handout   Person(s) Educated Patient   Methods Explanation;Handout   Comprehension Verbalized understanding             PT Long Term Goals - 06/15/15 1521    PT LONG TERM GOAL #1   Title demonstrate negative positional testing (07/13/15)   Time 4   Period Weeks   Status New               Plan - 06/15/15 1520    Clinical Impression Statement Pt presents to OPPT with clinical findings consistent with R posterior BPPV.  Treated with canalith repositioning x 2 reps with near resolve of symptoms on 2nd rep.   Pt will benefit from skilled therapeutic intervention in order to improve on the following deficits Dizziness   Rehab Potential Excellent   PT Frequency 1x / week   PT Duration 4 weeks   PT Treatment/Interventions Canalith Repostioning;Patient/family education;ADLs/Self Care Home Management;Neuromuscular re-education;Therapeutic exercise;Therapeutic activities;Vestibular   PT Next Visit Plan reassess; check horizontal canal and treat as indicated   Consulted and Agree with Plan of Care Patient         Problem List Patient Active Problem List   Diagnosis Date Noted  . S/P cervical spinal fusion 02/08/2015   Clarita Crane, PT, DPT 06/15/2015 3:24 PM  [facility] [address]  [address] [place], [place], [address] Phone: [phone]   Fax:  [phone]

## 2015-06-20 ENCOUNTER — Ambulatory Visit: Payer: Managed Care, Other (non HMO) | Admitting: Physical Therapy

## 2015-06-20 DIAGNOSIS — H8111 Benign paroxysmal vertigo, right ear: Secondary | ICD-10-CM

## 2015-06-20 NOTE — Therapy (Signed)
[facility] [address]  [place], [place], [address] Phone: [phone]   Fax:  [phone]  Physical Therapy Treatment  Patient Details  Name: Monica Savage MRN: [id number] Date of Birth: [birth year] Referring Provider:  Eustace Moore, MD  Encounter Date: 06/20/2015      PT End of Session - 06/20/15 1517    Visit Number 2   Number of Visits 4   Date for PT Re-Evaluation 07/13/15   PT Start Time 9937   PT Stop Time 1515   PT Time Calculation (min) 27 min   Activity Tolerance Patient tolerated treatment well   Behavior During Therapy [facility] for tasks assessed/performed      Past Medical History  Diagnosis Date  . Hypertension   . COPD (chronic obstructive pulmonary disease)     chronic bronchitis  . Depression   . Anxiety   . Scoliosis     Past Surgical History  Procedure Laterality Date  . Total hip arthroplasty Right 2008  . Back surgery  2010    lower  . Total hip arthroplasty Left 2012  . Knee surgery Right 2013  . Anterior cervical decompression/discectomy fusion 4 levels N/A 02/08/2015    Procedure: ANTERIOR CERVICAL DECOMPRESSION/DISCECTOMY FUSION 4 LEVELS;  Surgeon: Eustace Moore, MD;  Location: [facility];  Service: Neurosurgery;  Laterality: N/A;  ANTERIOR CERVICAL DECOMPRESSION/DISCECTOMY FUSION 4 LEVELS CERVICAL 3-7    There were no vitals filed for this visit.  Visit Diagnosis:  Benign paroxysmal positional vertigo, right      Subjective Assessment - 06/20/15 1451    Subjective no more spinning; having some symptoms with standing after sitting or lying down for a long time   Patient Stated Goals eliminate dizziness   Currently in Pain? Yes   Pain Score 2    Pain Location Neck   Pain Orientation Anterior;Left;Right   Pain Descriptors / Indicators --  stinging pain   Pain Onset 1 to 4 weeks ago   Pain Frequency Occasional   Aggravating Factors  with turning head; still has  occasional stinging pain            [facility] PT Assessment - 06/20/15 1520    Observation/Other Assessments   Focus on Therapeutic Outcomes (FOTO)  90 (10% limited)            Vestibular Assessment - 06/20/15 1453    Positional Testing   Dix-Hallpike Dix-Hallpike Right;Dix-Hallpike Left   Horizontal Canal Testing Horizontal Canal Right;Horizontal Canal Left   Dix-Hallpike Right   Dix-Hallpike Right Symptoms No nystagmus   Dix-Hallpike Left   Dix-Hallpike Left Symptoms No nystagmus   Horizontal Canal Right   Horizontal Canal Right Symptoms Normal   Horizontal Canal Left   Horizontal Canal Left Symptoms Normal   Orthostatics   BP supine (x 5 minutes) 148/92 mmHg   HR supine (x 5 minutes) 62   BP standing (after 1 minute) 150/90 mmHg   HR standing (after 1 minute) 67   BP standing (after 3 minutes) 152/94 mmHg   HR standing (after 3 minutes) 71   Orthostatics Comment mild lightheadedness with supine to stand                 [facility] Adult PT Treatment/Exercise - 06/20/15 1516    Self-Care   Self-Care Other Self-Care Comments   Other Self-Care Comments  discussed possible causes of symptoms with position changes including BP meds; possible electrolyte imbalance or  anxiety.  Feel pt's symptoms are no longer related to vestibular dysfunction and recommended pt follow up with MD.  Pt verbalized understanding                PT Education - 06/20/15 1517    Education provided Yes   Education Details see self care   Person(s) Educated Patient   Methods Explanation   Comprehension Verbalized understanding             PT Long Term Goals - 06/20/15 1519    PT LONG TERM GOAL #1   Title demonstrate negative positional testing (07/13/15)   Status Achieved               Plan - 06/20/15 1517    Clinical Impression Statement BPPV now cleared and remaining symptoms likely due to BP meds, decreased PO intake (pt reports forgetting to eat), and/or anxiety.   Orthostatics negative.  At this time ready for d/c.  Recommended follow up with MD if symptoms persist.   PT Next Visit Plan d/c PT        Problem List Patient Active Problem List   Diagnosis Date Noted  . S/P cervical spinal fusion 02/08/2015   Laureen Abrahams, PT, DPT 06/20/2015 3:21 PM  [facility] [address]  [place], [place], [address] Phone: [phone]   Fax:  [phone]     PHYSICAL THERAPY DISCHARGE SUMMARY  Visits from Start of Care: 2  Current functional level related to goals / functional outcomes: See above all goals met   Remaining deficits: See above; recommended follow up with MD if lightheadedness persists.  At this time do not feel symptoms of lightheadedness related to vestibular dysfunction as all testing negative.   Education / Equipment: BPPV  Plan: Patient agrees to discharge.  Patient goals were met. Patient is being discharged due to meeting the stated rehab goals.  ?????   Laureen Abrahams, PT, DPT 06/20/2015 3:21 PM  [facility] [place], [place] [address]  [phone] (office) [phone] (fax)

## 2015-08-28 ENCOUNTER — Ambulatory Visit: Payer: Managed Care, Other (non HMO) | Attending: Orthopaedic Surgery | Admitting: Physical Therapy

## 2015-08-28 DIAGNOSIS — R269 Unspecified abnormalities of gait and mobility: Secondary | ICD-10-CM | POA: Diagnosis present

## 2015-08-28 DIAGNOSIS — M25561 Pain in right knee: Secondary | ICD-10-CM | POA: Insufficient documentation

## 2015-08-28 DIAGNOSIS — R262 Difficulty in walking, not elsewhere classified: Secondary | ICD-10-CM | POA: Insufficient documentation

## 2015-08-28 DIAGNOSIS — M25661 Stiffness of right knee, not elsewhere classified: Secondary | ICD-10-CM | POA: Insufficient documentation

## 2015-08-28 DIAGNOSIS — R29898 Other symptoms and signs involving the musculoskeletal system: Secondary | ICD-10-CM | POA: Insufficient documentation

## 2015-08-28 NOTE — Therapy (Signed)
[facility] [address]  [address] [place], [place], [address] Phone: [phone]   Fax:  [phone]  Physical Therapy Evaluation  Patient Details  Name: Monica Savage MRN: [id number] Date of Birth: 03-02-1951 Referring Provider:  Nyra Capes, MD  Encounter Date: 08/28/2015      PT End of Session - 08/28/15 1457    Visit Number 1   Number of Visits 12   Date for PT Re-Evaluation 10/09/15   PT Start Time 1400   PT Stop Time 1450   PT Time Calculation (min) 50 min   Activity Tolerance Patient tolerated treatment well   Behavior During Therapy [facility] for tasks assessed/performed      Past Medical History  Diagnosis Date  . Hypertension   . COPD (chronic obstructive pulmonary disease)     chronic bronchitis  . Depression   . Anxiety   . Scoliosis     Past Surgical History  Procedure Laterality Date  . Total hip arthroplasty Right 2008  . Back surgery  2010    lower  . Total hip arthroplasty Left 2012  . Knee surgery Right 2013  . Anterior cervical decompression/discectomy fusion 4 levels N/A 02/08/2015    Procedure: ANTERIOR CERVICAL DECOMPRESSION/DISCECTOMY FUSION 4 LEVELS;  Surgeon: Tia Alert, MD;  Location: [facility];  Service: Neurosurgery;  Laterality: N/A;  ANTERIOR CERVICAL DECOMPRESSION/DISCECTOMY FUSION 4 LEVELS CERVICAL 3-7    There were no vitals filed for this visit.  Visit Diagnosis:  Right knee pain  Stiffness of knee joint, right  Weakness of right leg  Difficulty walking  Abnormality of gait      Subjective Assessment - 08/28/15 1409    Subjective Patient underwent right TKR on 06/21/15 with 2 weeks HH PT after discharge from acute. Patient reports initially had good outcomes for ROM with HH PT and not referred for OP PT at that time secondary to MD pleased with progress from [facility] PT. Patient continued with HEP from [facility] PT until bout of shingles in mid-August prevented her from  doing exercises. Now presenting to OP PT for continued swelling and tightness in knee.   Pertinent History Right TKR - 06/21/15   Limitations Sitting;Standing;Walking   How long can you sit comfortably? 20-30 minutes (due to left sciatica)   How long can you stand comfortably? 30-45 minutes (due to sciatica)   How long can you walk comfortably? 45-60 minutes with something to balance on (ie - shopping cart)   Patient Stated Goals "To make sure I'm improving my degrees and keeping the swelling down"   Currently in Pain? No/denies   Pain Score --  Least 0/10, Avg 5/10 (intermittent), Worst 5/10   Pain Location Knee   Pain Orientation Right   Pain Descriptors / Indicators Shooting   Pain Onset 1 to 4 weeks ago  3 weeks   Pain Frequency Intermittent   Aggravating Factors  When knee swells, later in day   Pain Relieving Factors Tramadol   Multiple Pain Sites No            [facility] PT Assessment - 08/28/15 1401    Assessment   Medical Diagnosis Right TKR   Onset Date/Surgical Date 06/21/15   Next MD Visit unknown   Prior Therapy [facility] PT   Balance Screen   Has the patient fallen in the past 6 months Yes   How many times? 4 - result of dizziness from prior BPPV   Has  the patient had a decrease in activity level because of a fear of falling?  Yes   Is the patient reluctant to leave their home because of a fear of falling?  Yes   Prior Function   Level of Independence Independent   Observation/Other Assessments   Focus on Therapeutic Outcomes (FOTO)  47% (53% limitation); Predicted 54% (46% limitation)   ROM / Strength   AROM / PROM / Strength AROM;PROM;Strength   AROM   AROM Assessment Site Knee   Right/Left Knee Right   Right Knee Extension 5   Right Knee Flexion 119   PROM   PROM Assessment Site Knee   Right/Left Knee Right   Right Knee Extension 1   Right Knee Flexion 127   Strength   Strength Assessment Site Knee;Hip   Right/Left Hip Right;Left   Right Hip Flexion 4/5    Right Hip Extension 4-/5   Right Hip ABduction 4-/5   Right Hip ADduction 4-/5   Left Hip Flexion 4+/5   Left Hip Extension 4/5   Left Hip ABduction 4+/5   Left Hip ADduction 4+/5   Right/Left Knee Right;Left   Right Knee Flexion 4/5   Right Knee Extension 4/5   Left Knee Flexion 5/5   Left Knee Extension 5/5   Palpation   Patella mobility Mildly restricted sup/inf   Palpation comment Mild incisional adhesions over distal portion of incision   Ambulation/Gait   Assistive device Straight cane  HurryCane - used on right   Gait Pattern Step-to pattern;Decreased weight shift to right;Right flexed knee in stance  Impaired reciprocal arm swing due to cane used on wrong side   Gait Comments Provided instruction in proper use of cane including correct hand use, sequencing of cane for recpirocal arm swing, step-through pattern and proper heel strike to facilitate knee extension during gait                           PT Education - 08/28/15 1937    Education provided Yes   Education Details PT POC, proper gait pattern with cane   Person(s) Educated Patient   Methods Explanation;Demonstration   Comprehension Verbalized understanding;Returned demonstration;Need further instruction             PT Long Term Goals - 08/28/15 1938    PT LONG TERM GOAL #1   Title Independent with HEP (10/09/15)   Time 6   Period Weeks   Status New   PT LONG TERM GOAL #2   Title Patient will ambulate without cane demonstrating normal gait pattern (10/09/15)   Time 6   Period Weeks   Status New   PT LONG TERM GOAL #3   Title Patient will ascend/descend stairs reciprocally with normal step pattern (10/09/15)   Time 6   Period Weeks   Status New   PT LONG TERM GOAL #4   Title Right knee AROM 0-125 or greater without pain for functional gait and stairs climbing (10/09/15)   Time 6   Period Weeks   Status New   PT LONG TERM GOAL #5   Title Right knee strength 4+/5 or greater for  iimproved stability while ambulating (10/09/15)   Time 6   Period Weeks   Status New               Plan - 08/28/15 1926    Clinical Impression Statement Patient is a 64 y/o female who presents to OP PT ~2  months s/p right TKR on 06/21/15. Patient presents with good right knee flexion ROM but limited active TKE by 5 degrees. Right hip and knee strength grossly 4- to 4/5. Patient demonstrates abnormal gait pattern in part due to use of cane in wrong hand. Gait training provided with explanation of normal gait pattern, instruction in proper use of cane, and correction of gait pattern to facilitate symmetrical step length and good heel strike.   Pt will benefit from skilled therapeutic intervention in order to improve on the following deficits Pain;Impaired flexibility;Decreased range of motion;Decreased strength;Difficulty walking;Abnormal gait;Decreased balance;Decreased activity tolerance   Rehab Potential Good   PT Frequency 2x / week   PT Duration 6 weeks   PT Treatment/Interventions Therapeutic exercise;Manual techniques;Passive range of motion;Therapeutic activities;Gait training;Stair training;Balance training;Electrical Stimulation;Cryotherapy;Vasopneumatic Device;Iontophoresis /ml Dexamethasone;Patient/family education   PT Next Visit Plan Reinforce proper gait sequence, Initiation of HEP, Right knee ROM/strengthening with emphasis on TKE   Consulted and Agree with Plan of Care Patient         Problem List Patient Active Problem List   Diagnosis Date Noted  . S/P cervical spinal fusion 02/08/2015    Marry Guan, PT, MPT 08/28/2015, 7:47 PM  [facility] [address]  [address] [place], [place], [address] Phone: [phone]   Fax:  [phone]

## 2015-08-30 ENCOUNTER — Ambulatory Visit: Payer: Managed Care, Other (non HMO) | Admitting: Rehabilitation

## 2015-08-30 ENCOUNTER — Encounter: Payer: Self-pay | Admitting: Rehabilitation

## 2015-08-30 DIAGNOSIS — M25661 Stiffness of right knee, not elsewhere classified: Secondary | ICD-10-CM

## 2015-08-30 DIAGNOSIS — M25561 Pain in right knee: Secondary | ICD-10-CM | POA: Diagnosis not present

## 2015-08-30 DIAGNOSIS — R262 Difficulty in walking, not elsewhere classified: Secondary | ICD-10-CM

## 2015-08-30 NOTE — Therapy (Signed)
[facility] [address]  [address] [place], [place], [address] Phone: [phone]   Fax:  [phone]  Physical Therapy Treatment  Patient Details  Name: Monica Savage MRN: [id number] Date of Birth: 23-Oct-1951 Referring Myeesha Shane:  Nyra Capes, MD  Encounter Date: 08/30/2015      PT End of Session - 08/30/15 1357    Visit Number 2   Number of Visits 12   Date for PT Re-Evaluation 10/09/15   PT Start Time 1319   PT Stop Time 1357   PT Time Calculation (min) 38 min      Past Medical History  Diagnosis Date  . Hypertension   . COPD (chronic obstructive pulmonary disease)     chronic bronchitis  . Depression   . Anxiety   . Scoliosis     Past Surgical History  Procedure Laterality Date  . Total hip arthroplasty Right 2008  . Back surgery  2010    lower  . Total hip arthroplasty Left 2012  . Knee surgery Right 2013  . Anterior cervical decompression/discectomy fusion 4 levels N/A 02/08/2015    Procedure: ANTERIOR CERVICAL DECOMPRESSION/DISCECTOMY FUSION 4 LEVELS;  Surgeon: Tia Alert, MD;  Location: [facility];  Service: Neurosurgery;  Laterality: N/A;  ANTERIOR CERVICAL DECOMPRESSION/DISCECTOMY FUSION 4 LEVELS CERVICAL 3-7    There were no vitals filed for this visit.  Visit Diagnosis:  Right knee pain  Stiffness of knee joint, right  Difficulty walking      Subjective Assessment - 08/30/15 1320    Subjective reports that the stocking helped greatly with the swelling as well as ibuprofen but is in alot of pain now after taking it off with the swelling quicly returning.  Has also started doing massage.  The pain is in both hips and the R knee   Currently in Pain? Yes   Pain Score 6   due to the swelling   Pain Location Knee   Pain Orientation Right                         [facility] Adult PT Treatment/Exercise - 08/30/15 0001    Ambulation/Gait   Gait Comments practiced gait  with SPC in correct UE  x 583ft with correct sequencing today   Exercises   Exercises Knee/Hip   Knee/Hip Exercises: Aerobic   Stationary Bike x39min for ROM fwd/back and then revolutions   Tread Mill x15min 1. for gait with focus heel to toe; stopping due to L LE sciatica pain   Knee/Hip Exercises: Supine   Short Arc Quad Sets AROM;Strengthening;Right;2 sets;10 reps   Short Arc Quad Sets Limitations 5" holds   Terminal Knee Extension Strengthening;Right;2 sets;15 reps   Theraband Level (Terminal Knee Extension) Level 3 (Green)   Manual Therapy   Manual therapy comments PROM into flexion; STM distal to proximal at calf into distal thigh for edema; patellar glides M/L, Inf/Sup                     PT Long Term Goals - 08/28/15 1938    PT LONG TERM GOAL #1   Title Independent with HEP (10/09/15)   Time 6   Period Weeks   Status New   PT LONG TERM GOAL #2   Title Patient will ambulate without cane demonstrating normal gait pattern (10/09/15)   Time 6   Period Weeks   Status New   PT LONG TERM  GOAL #3   Title Patient will ascend/descend stairs reciprocally with normal step pattern (10/09/15)   Time 6   Period Weeks   Status New   PT LONG TERM GOAL #4   Title Right knee AROM 0-125 or greater without pain for functional gait and stairs climbing (10/09/15)   Time 6   Period Weeks   Status New   PT LONG TERM GOAL #5   Title Right knee strength 4+/5 or greater for iimproved stability while ambulating (10/09/15)   Time 6   Period Weeks   Status New               Plan - 08/30/15 1351    Clinical Impression Statement pt with increased L hip and leg pain today limiting treatment but tolerated all well.  palpable edema to R knee during patellar glides and Soft tissue work.     PT Next Visit Plan   HEP, Right knee ROM/strengthening with emphasis on TKE        Problem List Patient Active Problem List   Diagnosis Date Noted  . S/P cervical spinal fusion  02/08/2015    Idamae Lusher, DPT, CMP 08/30/2015, 2:00 PM  [facility] [address]  [address] [place], [place], [address] Phone: [phone]   Fax:  [phone]

## 2015-09-04 ENCOUNTER — Ambulatory Visit: Payer: Managed Care, Other (non HMO) | Admitting: Physical Therapy

## 2015-09-04 DIAGNOSIS — R269 Unspecified abnormalities of gait and mobility: Secondary | ICD-10-CM

## 2015-09-04 DIAGNOSIS — M25561 Pain in right knee: Secondary | ICD-10-CM

## 2015-09-04 DIAGNOSIS — M25661 Stiffness of right knee, not elsewhere classified: Secondary | ICD-10-CM

## 2015-09-04 DIAGNOSIS — R262 Difficulty in walking, not elsewhere classified: Secondary | ICD-10-CM

## 2015-09-04 DIAGNOSIS — R29898 Other symptoms and signs involving the musculoskeletal system: Secondary | ICD-10-CM

## 2015-09-04 NOTE — Therapy (Signed)
[facility] [address]  [address] [place], [place], [address] Phone: [phone]   Fax:  [phone]  Physical Therapy Treatment  Patient Details  Name: Monica Savage MRN: [id number] Date of Birth: 05/06/1951 Referring Provider:  Nyra Capes, MD  Encounter Date: 09/04/2015      PT End of Session - 09/04/15 1333    Visit Number 3   Number of Visits 12   Date for PT Re-Evaluation 10/09/15   PT Start Time 1316   PT Stop Time 1403   PT Time Calculation (min) 47 min   Activity Tolerance Patient limited by pain;Patient tolerated treatment well   Behavior During Therapy [facility] for tasks assessed/performed  Briefly tearful      Past Medical History  Diagnosis Date  . Hypertension   . COPD (chronic obstructive pulmonary disease)     chronic bronchitis  . Depression   . Anxiety   . Scoliosis     Past Surgical History  Procedure Laterality Date  . Total hip arthroplasty Right 2008  . Back surgery  2010    lower  . Total hip arthroplasty Left 2012  . Knee surgery Right 2013  . Anterior cervical decompression/discectomy fusion 4 levels N/A 02/08/2015    Procedure: ANTERIOR CERVICAL DECOMPRESSION/DISCECTOMY FUSION 4 LEVELS;  Surgeon: Tia Alert, MD;  Location: [facility];  Service: Neurosurgery;  Laterality: N/A;  ANTERIOR CERVICAL DECOMPRESSION/DISCECTOMY FUSION 4 LEVELS CERVICAL 3-7    There were no vitals filed for this visit.  Visit Diagnosis:  Right knee pain  Stiffness of knee joint, right  Difficulty walking  Weakness of right leg  Abnormality of gait      Subjective Assessment - 09/04/15 1320    Subjective Patient reports no pain in her right (surgical) knee today but left hip "is killing her". States she has been wearing the compression stocking during the day and taking it off at night, with noticeable improvement in swelling.   Currently in Pain? Yes   Pain Score 7    Pain Location Hip    Pain Orientation Left                 [facility] Adult PT Treatment/Exercise - 09/04/15 1316    Ambulation/Gait   Gait Comments practiced gait with SPC in correct UE x 540ft with correct sequencing but cues to avoid leaning into cane today   Exercises   Exercises Knee/Hip   Knee/Hip Exercises: Stretches   Passive Hamstring Stretch Both;20 seconds;3 reps   Passive Hamstring Stretch Limitations manual   Knee/Hip Exercises: Standing   Terminal Knee Extension Right;10 reps   Terminal Knee Extension Limitations 5" hold with small ball   Knee/Hip Exercises: Supine   Hip Adduction Isometric Strengthening;Both;10 reps;2 sets  3" hold   Hip Adduction Isometric Limitations hooklying with small ball   Bridges with Ball Squeeze Strengthening;Both;10 reps  3" hold   Knee Flexion AROM;Both;10 reps   Knee Flexion Limitations feet on peanut ball   Other Supine Knee/Hip Exercises Quad/hip ext isometrics into peanut ball 10x3"   Other Supine Knee/Hip Exercises Hooklying hip Abduction with green TB 10x3"                PT Education - 09/04/15 1550    Education provided Yes   Education Details Initial HEP   Person(s) Educated Patient   Methods Explanation;Demonstration;Handout   Comprehension Verbalized understanding;Returned demonstration;Need further instruction  PT Long Term Goals - 09/04/15 1551    PT LONG TERM GOAL #1   Title Independent with HEP (10/09/15)   Status On-going   PT LONG TERM GOAL #2   Title Patient will ambulate without cane demonstrating normal gait pattern (10/09/15)   Status On-going   PT LONG TERM GOAL #3   Status On-going   PT LONG TERM GOAL #4   Title Right knee AROM 0-125 or greater without pain for functional gait and stairs climbing (10/09/15)   Status On-going   PT LONG TERM GOAL #5   Title Right knee strength 4+/5 or greater for iimproved stability while ambulating (10/09/15)   Status On-going               Plan -  09/04/15 1542    Clinical Impression Statement Patient continues to report increased left hip/groin pain with feeling of needing to internally rotate hip while walking to allieviate pain. Targeted stretches and LE exercises to improve hip and knee flexibility bilaterally and improve strength for increased stability and decreased pain. Patient provided with HEP consisting of exercises able to be perfomed during therapy without pain, and instructions provided to stop exercises at home if pain returns or increases.   PT Next Visit Plan Review HEP, Right knee ROM/strengthening with emphasis on TKE   Consulted and Agree with Plan of Care Patient        Problem List Patient Active Problem List   Diagnosis Date Noted  . S/P cervical spinal fusion 02/08/2015    Marry Guan, PT, MPT 09/04/2015, 3:55 PM  [facility] [address]  [address] [place], [place], [address] Phone: [phone]   Fax:  [phone]

## 2015-09-11 ENCOUNTER — Ambulatory Visit: Payer: Managed Care, Other (non HMO) | Attending: Neurological Surgery | Admitting: Rehabilitation

## 2015-09-11 DIAGNOSIS — M25561 Pain in right knee: Secondary | ICD-10-CM | POA: Diagnosis not present

## 2015-09-11 DIAGNOSIS — M25661 Stiffness of right knee, not elsewhere classified: Secondary | ICD-10-CM | POA: Diagnosis present

## 2015-09-11 DIAGNOSIS — R262 Difficulty in walking, not elsewhere classified: Secondary | ICD-10-CM | POA: Diagnosis present

## 2015-09-11 DIAGNOSIS — R29898 Other symptoms and signs involving the musculoskeletal system: Secondary | ICD-10-CM

## 2015-09-11 DIAGNOSIS — R269 Unspecified abnormalities of gait and mobility: Secondary | ICD-10-CM | POA: Diagnosis present

## 2015-09-11 NOTE — Therapy (Signed)
[facility] Outpatient Rehabilitation [facility] [address]  [address] [place], [place], [address] Phone: [phone]   Fax:  [phone]  Physical Therapy Treatment  Patient Details  Name: Monica Savage MRN: [id number] Date of Birth: 1950/12/24 Referring Provider:  Nyra Capes, MD  Encounter Date: 09/11/2015      PT End of Session - 09/11/15 1312    Visit Number 4   Number of Visits 12   Date for PT Re-Evaluation 10/09/15   PT Start Time 1312   PT Stop Time 1352   PT Time Calculation (min) 40 min      Past Medical History  Diagnosis Date  . Hypertension   . COPD (chronic obstructive pulmonary disease)     chronic bronchitis  . Depression   . Anxiety   . Scoliosis     Past Surgical History  Procedure Laterality Date  . Total hip arthroplasty Right 2008  . Back surgery  2010    lower  . Total hip arthroplasty Left 2012  . Knee surgery Right 2013  . Anterior cervical decompression/discectomy fusion 4 levels N/A 02/08/2015    Procedure: ANTERIOR CERVICAL DECOMPRESSION/DISCECTOMY FUSION 4 LEVELS;  Surgeon: Tia Alert, MD;  Location: [facility];  Service: Neurosurgery;  Laterality: N/A;  ANTERIOR CERVICAL DECOMPRESSION/DISCECTOMY FUSION 4 LEVELS CERVICAL 3-7    There were no vitals filed for this visit.  Visit Diagnosis:  Right knee pain  Stiffness of knee joint, right  Difficulty walking  Weakness of right leg  Abnormality of gait      Subjective Assessment - 09/11/15 1315    Subjective States her Rt knee is about the same but her hip is still killing her. States she has been massaging her knee.   Currently in Pain? Yes   Pain Score 4    Pain Location Hip  Lt hip, Rt knee   Pain Orientation Left                         [facility] Adult PT Treatment/Exercise - 09/11/15 1317    Exercises   Exercises Knee/Hip   Knee/Hip Exercises: Aerobic   Stationary Bike x26min full revolutions   Knee/Hip Exercises:  Standing   Hip Flexion Both;10 reps;Knee bent   Hip Flexion Limitations on Blue foam, 2 pole assist   Terminal Knee Extension Right;15 reps   Terminal Knee Extension Limitations 5" hold with small ball   Hip Abduction Both;10 reps;Knee straight   Abduction Limitations on Blue foam, 2 pole assist   Lateral Step Up Right;10 reps;Hand Hold: 2;Step Height: 6"   Forward Step Up Right;10 reps;Hand Hold: 2;Step Height: 6"   Knee/Hip Exercises: Seated   Hamstring Curl Right;10 reps   Hamstring Limitations Green TB   Knee/Hip Exercises: Supine   Short Arc Quad Sets Strengthening;Left;10 reps   Short Arc Quad Sets Limitations 2#, 5" hold   Bridges Both;10 reps   Bridges Limitations 3" holds                     PT Long Term Goals - 09/04/15 1551    PT LONG TERM GOAL #1   Title Independent with HEP (10/09/15)   Status On-going   PT LONG TERM GOAL #2   Title Patient will ambulate without cane demonstrating normal gait pattern (10/09/15)   Status On-going   PT LONG TERM GOAL #3   Status On-going   PT LONG TERM GOAL #4  Title Right knee AROM 0-125 or greater without pain for functional gait and stairs climbing (10/09/15)   Status On-going   PT LONG TERM GOAL #5   Title Right knee strength 4+/5 or greater for iimproved stability while ambulating (10/09/15)   Status On-going               Plan - 09/11/15 1352    Clinical Impression Statement Good tolerance to exercises and increased reps today. Tried more standing exercises and will increased HEP with standing exercises if pt does ok. Denied pain with exercises during therapy and states she felt fine afterwards.    PT Next Visit Plan  Right knee ROM/strengthening with emphasis on TKE, increase HEP?   Consulted and Agree with Plan of Care Patient        Problem List Patient Active Problem List   Diagnosis Date Noted  . S/P cervical spinal fusion 02/08/2015    Ronney Lion, PTA 09/11/2015, 1:54 PM  [facility] [address]  [address] [place], [place], [address] Phone: [phone]   Fax:  [phone]

## 2015-09-14 ENCOUNTER — Ambulatory Visit: Payer: Managed Care, Other (non HMO) | Admitting: Physical Therapy

## 2015-09-14 DIAGNOSIS — M25561 Pain in right knee: Secondary | ICD-10-CM

## 2015-09-14 DIAGNOSIS — R269 Unspecified abnormalities of gait and mobility: Secondary | ICD-10-CM

## 2015-09-14 DIAGNOSIS — R262 Difficulty in walking, not elsewhere classified: Secondary | ICD-10-CM

## 2015-09-14 DIAGNOSIS — R29898 Other symptoms and signs involving the musculoskeletal system: Secondary | ICD-10-CM

## 2015-09-14 DIAGNOSIS — M25661 Stiffness of right knee, not elsewhere classified: Secondary | ICD-10-CM

## 2015-09-14 NOTE — Therapy (Signed)
[facility] Outpatient Rehabilitation [facility] [address]  [address] [place], [place], [address] Phone: [phone]   Fax:  [phone]  Physical Therapy Treatment  Patient Details  Name: Monica Savage MRN: [id number] Date of Birth: [birth year] Referring Provider:  Nyra Capes, MD  Encounter Date: 09/14/2015      PT End of Session - 09/14/15 1324    Visit Number 5   Number of Visits 12   Date for PT Re-Evaluation 10/09/15   PT Start Time 1315   PT Stop Time 1402   PT Time Calculation (min) 47 min   Activity Tolerance Patient tolerated treatment well   Behavior During Therapy [facility] for tasks assessed/performed      Past Medical History  Diagnosis Date  . Hypertension   . COPD (chronic obstructive pulmonary disease)     chronic bronchitis  . Depression   . Anxiety   . Scoliosis     Past Surgical History  Procedure Laterality Date  . Total hip arthroplasty Right 2008  . Back surgery  2010    lower  . Total hip arthroplasty Left 2012  . Knee surgery Right 2013  . Anterior cervical decompression/discectomy fusion 4 levels N/A 02/08/2015    Procedure: ANTERIOR CERVICAL DECOMPRESSION/DISCECTOMY FUSION 4 LEVELS;  Surgeon: Tia Alert, MD;  Location: [facility];  Service: Neurosurgery;  Laterality: N/A;  ANTERIOR CERVICAL DECOMPRESSION/DISCECTOMY FUSION 4 LEVELS CERVICAL 3-7    There were no vitals filed for this visit.  Visit Diagnosis:  Right knee pain  Stiffness of knee joint, right  Difficulty walking  Weakness of right leg  Abnormality of gait      Subjective Assessment - 09/14/15 1318    Subjective Patient reports she called her surgeon about the continued swelling in her knee. States MD told her she could increase her Ibuprofen up to /day and to continue to use the knee brace and/or compresion stocking with at least 1 hr off per day.   Currently in Pain? Yes   Pain Score 5    Pain Location Knee   Pain Orientation  Left   Pain Descriptors / Indicators --  "Pain with touch, like a bruise"            [facility] PT Assessment - 09/14/15 1315    Assessment   Next MD Visit 09/22/15   ROM / Strength   AROM / PROM / Strength AROM;Strength   AROM   AROM Assessment Site Knee   Right/Left Knee Right   Right Knee Extension 3   Right Knee Flexion 125   PROM   PROM Assessment Site Knee   Right/Left Knee Right   Right Knee Extension 0   Right Knee Flexion 130   Strength   Right/Left Knee Right   Right Knee Flexion 4/5   Right Knee Extension 4+/5                     [facility] Adult PT Treatment/Exercise - 09/14/15 1315    Exercises   Exercises Knee/Hip   Knee/Hip Exercises: Stretches   Passive Hamstring Stretch Both;20 seconds;3 reps  2 sets   Passive Hamstring Stretch Limitations 1st set with strap, 2nd set manual   Piriformis Stretch Left;20 seconds;3 reps   Piriformis Stretch Limitations Instructed in 3 ways to complete stretch (see pt instructions)   Knee/Hip Exercises: Aerobic   Stationary Bike lvl 1 x 5 min   Knee/Hip Exercises: Standing   Lateral Step Up  Right;10 reps;Hand Hold: 1;Step Height: 6";Hand Hold: 0   Lateral Step Up Limitations 1 pole A progressing to no UE support   Forward Step Up Right;10 reps;Hand Hold: 1   Forward Step Up Limitations 1 pole A   Functional Squat 10 reps;3 seconds   Functional Squat Limitations TRX   Knee/Hip Exercises: Supine   Short Arc Quad Sets Strengthening;Left;10 reps   Short Arc Quad Sets Limitations 3#, 5" hold   Bridges Both;10 reps   Bridges Limitations 3" holds                PT Education - 09/14/15 1512    Education provided Yes   Education Details Updated HEP - piriformis stretches & squats   Person(s) Educated Patient   Methods Explanation;Demonstration;Handout   Comprehension Verbalized understanding;Returned demonstration;Need further instruction             PT Long Term Goals - 09/14/15 1424    PT LONG  TERM GOAL #1   Title Independent with HEP (10/09/15)   Status On-going   PT LONG TERM GOAL #2   Title Patient will ambulate without cane demonstrating normal gait pattern (10/09/15)   Status On-going   PT LONG TERM GOAL #3   Title Patient will ascend/descend stairs reciprocally with normal step pattern (10/09/15)   Status On-going   PT LONG TERM GOAL #4   Title Right knee AROM 0-125 or greater without pain for functional gait and stairs climbing (10/09/15)   Status On-going   PT LONG TERM GOAL #5   Title Right knee strength 4+/5 or greater for iimproved stability while ambulating (10/09/15)   Status On-going               Plan - 09/14/15 1424    Clinical Impression Statement Knee brace appears to be trapping swelling in anterior knee, therefore recommended use of compression stocking in place of knee brace for edema control. Also encouraged patient to continue to ice knee as needed for swelling/pain. Instructed patient in piriformis stretches to address persistent left posterior hip/buttock pain/"sciatica" with HEP provided. Progressed standing exercises with good patient tolerance, with squats added to HEP as well. Right knee ROM improving with full supported extension achieved and flexion >120, with improving quad control/TKE.   PT Next Visit Plan  Right knee ROM/strengthening with emphasis on TKE, increase HEP?   Consulted and Agree with Plan of Care Patient        Problem List Patient Active Problem List   Diagnosis Date Noted  . S/P cervical spinal fusion 02/08/2015    Marry Guan, PT, MPT 09/14/2015, 3:19 PM  [facility] [address]  [address] [place], [place], [address] Phone: [phone]   Fax:  [phone]

## 2015-09-18 ENCOUNTER — Ambulatory Visit: Payer: Managed Care, Other (non HMO) | Admitting: Physical Therapy

## 2015-09-18 DIAGNOSIS — M25661 Stiffness of right knee, not elsewhere classified: Secondary | ICD-10-CM

## 2015-09-18 DIAGNOSIS — R29898 Other symptoms and signs involving the musculoskeletal system: Secondary | ICD-10-CM

## 2015-09-18 DIAGNOSIS — M25561 Pain in right knee: Secondary | ICD-10-CM | POA: Diagnosis not present

## 2015-09-18 DIAGNOSIS — R269 Unspecified abnormalities of gait and mobility: Secondary | ICD-10-CM

## 2015-09-18 DIAGNOSIS — R262 Difficulty in walking, not elsewhere classified: Secondary | ICD-10-CM

## 2015-09-18 NOTE — Therapy (Signed)
[facility] Outpatient Rehabilitation [facility] [address]  [address] [place], [place], [address] Phone: [phone]   Fax:  [phone]  Physical Therapy Treatment  Patient Details  Name: Monica Savage MRN: [id number] Date of Birth: 1951-02-18 Referring Provider:  Nyra Capes, MD  Encounter Date: 09/18/2015      PT End of Session - 09/18/15 1416    Visit Number 6   Number of Visits 12   Date for PT Re-Evaluation 10/09/15   PT Start Time 1406   PT Stop Time 1448   PT Time Calculation (min) 42 min   Activity Tolerance Patient tolerated treatment well   Behavior During Therapy [facility] for tasks assessed/performed      Past Medical History  Diagnosis Date  . Hypertension   . COPD (chronic obstructive pulmonary disease)     chronic bronchitis  . Depression   . Anxiety   . Scoliosis     Past Surgical History  Procedure Laterality Date  . Total hip arthroplasty Right 2008  . Back surgery  2010    lower  . Total hip arthroplasty Left 2012  . Knee surgery Right 2013  . Anterior cervical decompression/discectomy fusion 4 levels N/A 02/08/2015    Procedure: ANTERIOR CERVICAL DECOMPRESSION/DISCECTOMY FUSION 4 LEVELS;  Surgeon: Tia Alert, MD;  Location: [facility];  Service: Neurosurgery;  Laterality: N/A;  ANTERIOR CERVICAL DECOMPRESSION/DISCECTOMY FUSION 4 LEVELS CERVICAL 3-7    There were no vitals filed for this visit.  Visit Diagnosis:  Right knee pain  Stiffness of knee joint, right  Difficulty walking  Weakness of right leg  Abnormality of gait      Subjective Assessment - 09/18/15 1411    Subjective Reports knee feels better to day with no pain on arrival, but sciatic pain 4/10. Patient presents to therapy wearing compression stocking today and states she has been wearing it during the day and taking it off at night. Patient reports pain over weekend has felt more like a bruise (painful to touch but not otherwise hurting).   Currently in Pain? Yes   Pain Score 4    Pain Location Hip   Pain Orientation Left;Posterior;Lateral;Anterior                  [facility] Adult PT Treatment/Exercise - 09/18/15 1406    Exercises   Exercises Knee/Hip   Knee/Hip Exercises: Machines for Strengthening   Cybex Leg Press DL 13# K44, 01# U27   Knee/Hip Exercises: Standing   Lateral Step Up Right;Hand Hold: 0;Step Height: 8"   Lateral Step Up Limitations 2 pole A, stopped after 7 reps d/t c/o Lt lat knee pain and Rt hip/groin pain   Forward Step Up Right;10 reps;Hand Hold: 2;Step Height: 8"   Forward Step Up Limitations 2 pole A   Functional Squat 15 reps;3 seconds   Functional Squat Limitations TRX   Knee/Hip Exercises: Supine   Bridges Both;15 reps   Bridges Limitations 3" holds   Manual Therapy   Manual Therapy Soft tissue mobilization   Soft tissue mobilization Left piriformis in Rt sidelying                 PT Long Term Goals - 09/14/15 1424    PT LONG TERM GOAL #1   Title Independent with HEP (10/09/15)   Status On-going   PT LONG TERM GOAL #2   Title Patient will ambulate without cane demonstrating normal gait pattern (10/09/15)   Status On-going  PT LONG TERM GOAL #3   Title Patient will ascend/descend stairs reciprocally with normal step pattern (10/09/15)   Status On-going   PT LONG TERM GOAL #4   Title Right knee AROM 0-125 or greater without pain for functional gait and stairs climbing (10/09/15)   Status On-going   PT LONG TERM GOAL #5   Title Right knee strength 4+/5 or greater for iimproved stability while ambulating (10/09/15)   Status On-going               Plan - 09/18/15 1454    Clinical Impression Statement Pain and swelling better in right knee with change from knee brace to compression stocking. Left sciatic pain more limiting than right knee pain at this point, therefore targeted STM to left piriformis before proceeding with exercises. Able to progress exercises some  today but lateral step-ups limited by pain in right lateral knee and left hip.   PT Next Visit Plan  Right knee ROM/strengthening with emphasis on TKE, STM PRN, **MD note**   Consulted and Agree with Plan of Care Patient        Problem List Patient Active Problem List   Diagnosis Date Noted  . S/P cervical spinal fusion 02/08/2015    Marry Guan, PT, MPT 09/18/2015, 3:10 PM  [facility] [address]  [address] [place], [place], [address] Phone: [phone]   Fax:  [phone]

## 2015-09-20 ENCOUNTER — Ambulatory Visit: Payer: Managed Care, Other (non HMO) | Admitting: Rehabilitation

## 2015-09-20 DIAGNOSIS — R269 Unspecified abnormalities of gait and mobility: Secondary | ICD-10-CM

## 2015-09-20 DIAGNOSIS — R262 Difficulty in walking, not elsewhere classified: Secondary | ICD-10-CM

## 2015-09-20 DIAGNOSIS — M25561 Pain in right knee: Secondary | ICD-10-CM | POA: Diagnosis not present

## 2015-09-20 DIAGNOSIS — M25661 Stiffness of right knee, not elsewhere classified: Secondary | ICD-10-CM

## 2015-09-20 DIAGNOSIS — R29898 Other symptoms and signs involving the musculoskeletal system: Secondary | ICD-10-CM

## 2015-09-20 NOTE — Therapy (Signed)
[facility] [address]  [place], [place], [address] Phone: [phone]   Fax:  [phone]  Physical Therapy Treatment  Patient Details  Name: Monica Savage MRN: [id number] Date of Birth: Oct 02, 1951 Referring Provider:  Einar Crow, MD  Encounter Date: 09/20/2015      PT End of Session - 09/20/15 1314    Visit Number 7   Number of Visits 12   Date for PT Re-Evaluation 10/09/15   PT Start Time 5638   PT Stop Time 7564   PT Time Calculation (min) 40 min      Past Medical History  Diagnosis Date  . Hypertension   . COPD (chronic obstructive pulmonary disease)     chronic bronchitis  . Depression   . Anxiety   . Scoliosis     Past Surgical History  Procedure Laterality Date  . Total hip arthroplasty Right 2008  . Back surgery  2010    lower  . Total hip arthroplasty Left 2012  . Knee surgery Right 2013  . Anterior cervical decompression/discectomy fusion 4 levels N/A 02/08/2015    Procedure: ANTERIOR CERVICAL DECOMPRESSION/DISCECTOMY FUSION 4 LEVELS;  Surgeon: Eustace Moore, MD;  Location: [facility];  Service: Neurosurgery;  Laterality: N/A;  ANTERIOR CERVICAL DECOMPRESSION/DISCECTOMY FUSION 4 LEVELS CERVICAL 3-7    There were no vitals filed for this visit.  Visit Diagnosis:  Right knee pain  Stiffness of knee joint, right  Difficulty walking  Weakness of right leg  Abnormality of gait      Subjective Assessment - 09/20/15 1316    Subjective Tried walking without the cane today. States she feels the knee is strong enough but her hip is limiting her. Reports the compression stocking is helping but her knee is still really sensitive to the touch. Reports complaince with HEP. Doesn't feel like her knee really limits her from anything.Reports knee pain at worst would be a 4/10 and at best a 0/10. Feels 80% back to normal, most limitation noted with sweling.    Currently in Pain? Yes    Pain Score 6    Pain Location Hip   Pain Orientation Left;Posterior;Lateral            [facility] PT Assessment - 09/20/15 1318    ROM / Strength   AROM / PROM / Strength AROM;PROM;Strength   AROM   AROM Assessment Site Knee   Right/Left Knee Right   Right Knee Extension 2   Right Knee Flexion 125   PROM   PROM Assessment Site Knee   Right/Left Knee Right   Right Knee Extension 0   Right Knee Flexion 130   Strength   Strength Assessment Site Hip;Knee   Right/Left Hip Right   Right Hip Flexion 4/5   Right Hip Extension 4/5   Right Hip ABduction 4/5   Right Hip ADduction 4/5   Right/Left Knee Right   Right Knee Flexion 4+/5   Right Knee Extension 4+/5                     [facility] Adult PT Treatment/Exercise - 09/20/15 1319    Exercises   Exercises Knee/Hip   Knee/Hip Exercises: Aerobic   Stationary Bike lvl 1 x 5 min   Knee/Hip Exercises: Machines for Strengthening   Cybex Knee Extension DL 20# x10   Cybex Knee Flexion DL 25# x10   Cybex Leg Press DL 25# 2x10   Knee/Hip Exercises: Standing  Hip Flexion Both;10 reps;Knee bent  3" hold   Hip Flexion Limitations on Blue foam, 2 pole assist   Terminal Knee Extension Right;15 reps   Terminal Knee Extension Limitations 5" hold with small ball   Hip Abduction Both;10 reps;Knee straight   Abduction Limitations on Blue foam, 2 pole assist   Lateral Step Up Right;10 reps;Hand Hold: 1;Hand Hold: 2;Step Height: 8"   Lateral Step Up Limitations 1-2 pole A   Forward Step Up Right;Hand Hold: 1;Step Height: 8";Hand Hold: 2;15 reps   Forward Step Up Limitations 1-2 pole A   Functional Squat 15 reps;3 seconds   Functional Squat Limitations TRX   Knee/Hip Exercises: Seated   Other Seated Knee/Hip Exercises Leg press (1 black/1 blue) x15                     PT Long Term Goals - 09/20/15 1333    PT LONG TERM GOAL #1   Title Independent with HEP (10/09/15)   Status On-going   PT LONG TERM GOAL #2    Title Patient will ambulate without cane demonstrating normal gait pattern (10/09/15)   Status On-going   PT LONG TERM GOAL #3   Title Patient will ascend/descend stairs reciprocally with normal step pattern (10/09/15)   Status On-going   PT LONG TERM GOAL #4   Title Right knee AROM 0-125 or greater without pain for functional gait and stairs climbing (10/09/15)   Status On-going   PT LONG TERM GOAL #5   Title Right knee strength 4+/5 or greater for iimproved stability while ambulating (10/09/15)   Status On-going               Plan - 09/20/15 1354    Clinical Impression Statement Overall, pt has excelled wonderfully with therapy. Her Knee strength and ROM have done wonderful and are near normal. Still has some Rt hip weakness but coming along well. Her Lt hip seems to be limiting her more than her Rt knee. Pt has not met her goals yet and will most likely meet them in the next few weeks.    PT Next Visit Plan  Right knee ROM/strengthening with emphasis on TKE, STM PRN   Consulted and Agree with Plan of Care Patient        Problem List Patient Active Problem List   Diagnosis Date Noted  . S/P cervical spinal fusion 02/08/2015    Barbette Hair, PTA 09/20/2015, 1:57 PM  [facility] [address]  [place], [place], [address] Phone: [phone]   Fax:  [phone]

## 2015-09-26 ENCOUNTER — Ambulatory Visit: Payer: Managed Care, Other (non HMO) | Admitting: Physical Therapy

## 2015-09-28 ENCOUNTER — Ambulatory Visit: Payer: Managed Care, Other (non HMO)

## 2015-10-02 ENCOUNTER — Ambulatory Visit: Payer: Managed Care, Other (non HMO) | Admitting: Physical Therapy

## 2015-10-05 ENCOUNTER — Ambulatory Visit: Payer: Managed Care, Other (non HMO)

## 2015-10-10 ENCOUNTER — Ambulatory Visit: Payer: Managed Care, Other (non HMO) | Attending: Neurological Surgery | Admitting: Physical Therapy

## 2015-10-10 DIAGNOSIS — M5442 Lumbago with sciatica, left side: Secondary | ICD-10-CM

## 2015-10-10 DIAGNOSIS — M25552 Pain in left hip: Secondary | ICD-10-CM

## 2015-10-10 DIAGNOSIS — M25661 Stiffness of right knee, not elsewhere classified: Secondary | ICD-10-CM | POA: Diagnosis present

## 2015-10-10 DIAGNOSIS — R29898 Other symptoms and signs involving the musculoskeletal system: Secondary | ICD-10-CM

## 2015-10-10 DIAGNOSIS — R262 Difficulty in walking, not elsewhere classified: Secondary | ICD-10-CM | POA: Diagnosis present

## 2015-10-10 DIAGNOSIS — M25561 Pain in right knee: Secondary | ICD-10-CM

## 2015-10-10 DIAGNOSIS — R269 Unspecified abnormalities of gait and mobility: Secondary | ICD-10-CM

## 2015-10-10 NOTE — Therapy (Signed)
[facility] [place] [address]  [address] [place], [place], [address] Phone: [phone]   Fax:  [phone]  Physical Therapy Treatment  Patient Details  Name: Monica Savage MRN: [id number] Date of Birth: 1951/02/24 No Data Recorded  Encounter Date: 10/10/2015      PT End of Session - 10/10/15 1612    Visit Number 8   Number of Visits 16   Date for PT Re-Evaluation 11/10/15   PT Start Time 1530   PT Stop Time 1612   PT Time Calculation (min) 42 min   Activity Tolerance Patient tolerated treatment well   Behavior During [facility] for [facility] assessed/performed      Past Medical History  Diagnosis Date  . Hypertension   . COPD (chronic obstructive pulmonary disease)     chronic bronchitis  . Depression   . Anxiety   . Scoliosis     Past Surgical History  Procedure Laterality Date  . Total hip arthroplasty Right 2008  . Back surgery  2010    lower  . Total hip arthroplasty Left 2012  . Knee surgery Right 2013  . Anterior cervical decompression/discectomy fusion 4 levels N/A 02/08/2015    Procedure: ANTERIOR CERVICAL DECOMPRESSION/DISCECTOMY FUSION 4 LEVELS;  Surgeon: Tia Alertavid S Jones, MD;  [facility]: [facility];  Service: Neurosurgery;  Laterality: N/A;  ANTERIOR CERVICAL DECOMPRESSION/DISCECTOMY FUSION 4 LEVELS CERVICAL 3-7    There were no vitals filed for this visit.  Visit Diagnosis:  Right knee pain - Plan: PT plan of care cert/re-cert  Stiffness of knee joint, right - Plan: PT plan of care cert/re-cert  Difficulty walking - Plan: PT plan of care cert/re-cert  Weakness of right leg - Plan: PT plan of care cert/re-cert  Abnormality of gait - Plan: PT plan of care cert/re-cert  Left-sided low back pain with left-sided sciatica - Plan: PT plan of care cert/re-cert  Left hip pain - Plan: PT plan of care cert/re-cert      Subjective Assessment - 10/10/15 1534    Subjective Stopped taking pain medication  because she wasn't needing it.  Feels great today and feels like the R knee swelling is almost gone.  Now having L sided sciatic pain; and some bruising pain in R knee.   Pertinent History Right TKR - 06/21/15   Limitations Sitting;Standing;Walking   How long can you sit comfortably? 20-30 minutes (due to left sciatica)   How long can you stand comfortably? 30-45 minutes (due to sciatica)   How long can you walk comfortably? 45-60 minutes with something to balance on (ie - shopping cart)   Patient Stated Goals "To make sure I'm improving my degrees and keeping the swelling down"   Currently in Pain? Yes   Pain Score 3    Pain Location Knee   Pain Orientation Right   Pain Descriptors / Indicators Shooting   Pain Frequency Intermittent   Aggravating Factors  evening            [facility] PT Assessment - 10/10/15 1550    AROM   Right/Left Knee Right   Right Knee Extension 0   Right Knee Flexion 127  130 AAROM   Strength   Right Hip Flexion 3/5   Right Hip Extension 3/5   Right Hip ABduction 3/5   Right Hip ADduction 3/5   Right Knee Flexion 4+/5   Right Knee Extension 4+/5   Ambulation/Gait   Assistive device None   Gait Pattern  Decreased stance time - left;Decreased hip/knee flexion - left   Stairs Yes   Stair Management Technique Alternating pattern;One rail Right;Step to pattern   Gait Comments unable to perform stairs without demonstrating instability of bil knees/hips                     [facility] Adult PT Treatment/Exercise - 10/10/15 1550    Knee/Hip Exercises: Seated   Sit to Sand 10 reps;without UE support   Knee/Hip Exercises: Supine   Single Leg Bridge 10 reps;Both                     PT Long Term Goals - 10/10/15 1552    PT LONG TERM GOAL #1   Title Independent with HEP (12/216)   Time 5   Period Weeks   Status On-going   PT LONG TERM GOAL #2   Title Patient will ambulate without cane demonstrating normal gait pattern    Baseline has  baseline deviations   Status Achieved   PT LONG TERM GOAL #3   Title Patient will ascend/descend stairs reciprocally with normal step pattern (11/10/15)   Status On-going   PT LONG TERM GOAL #4   Title Right knee AROM 0-125 or greater without pain for functional gait and stairs climbing (10/09/15)   Status Achieved   PT LONG TERM GOAL #5   Title Right knee strength 4+/5 or greater for iimproved stability while ambulating (10/09/15)   Status Achieved   Additional Long Term Goals   Additional Long Term Goals Yes   PT LONG TERM GOAL #6   Title improve bil hip strength to at least 4+/5 for improved stability and function (11/10/15)   Time 5   Period Weeks   Status New               Plan - 10/10/15 1612    Clinical Impression Statement Pt presents to OPPT with missing 2 weeks due to illness.  Pt continues to demonstrate weakness affecting functional mobility most specifically in bil hips.  Pt will continue to benefit from PT to maximize functional mobility.   PT Next Visit Plan give HEP to address hip stability   Consulted and Agree with Plan of Care Patient        Problem List Patient Active Problem List   Diagnosis Date Noted  . S/P cervical spinal fusion 02/08/2015   Monica Savage, PT, DPT 10/10/2015 4:15 PM  [facility] [address]  [address] [place], [place], [address] Phone: [phone]   Fax:  [phone]  Name: Monica Savage MRN: [id number] Date of Birth: [birth year]

## 2015-10-17 ENCOUNTER — Ambulatory Visit: Payer: Managed Care, Other (non HMO) | Admitting: Physical Therapy

## 2015-10-17 DIAGNOSIS — R269 Unspecified abnormalities of gait and mobility: Secondary | ICD-10-CM

## 2015-10-17 DIAGNOSIS — M25552 Pain in left hip: Secondary | ICD-10-CM

## 2015-10-17 DIAGNOSIS — R262 Difficulty in walking, not elsewhere classified: Secondary | ICD-10-CM

## 2015-10-17 DIAGNOSIS — M25561 Pain in right knee: Secondary | ICD-10-CM | POA: Diagnosis not present

## 2015-10-17 DIAGNOSIS — M5442 Lumbago with sciatica, left side: Secondary | ICD-10-CM

## 2015-10-17 DIAGNOSIS — M25661 Stiffness of right knee, not elsewhere classified: Secondary | ICD-10-CM

## 2015-10-17 DIAGNOSIS — R29898 Other symptoms and signs involving the musculoskeletal system: Secondary | ICD-10-CM

## 2015-10-17 NOTE — Therapy (Signed)
[facility] [address]  [address] [place], [place], [address] Phone: [phone]   Fax:  [phone]  Physical Therapy Treatment  Patient Details  Name: Monica Savage MRN: [id number] Date of Birth: [birth year] No Data Recorded  Encounter Date: 10/17/2015      PT End of Session - 10/17/15 1533    Visit Number 9   Number of Visits 16   Date for PT Re-Evaluation 11/10/15   PT Start Time 1445   PT Stop Time 1531   PT Time Calculation (min) 46 min   Activity Tolerance Patient tolerated treatment well   Behavior During Therapy [facility] for tasks assessed/performed      Past Medical History  Diagnosis Date  . Hypertension   . COPD (chronic obstructive pulmonary disease)     chronic bronchitis  . Depression   . Anxiety   . Scoliosis     Past Surgical History  Procedure Laterality Date  . Total hip arthroplasty Right 2008  . Back surgery  2010    lower  . Total hip arthroplasty Left 2012  . Knee surgery Right 2013  . Anterior cervical decompression/discectomy fusion 4 levels N/A 02/08/2015    Procedure: ANTERIOR CERVICAL DECOMPRESSION/DISCECTOMY FUSION 4 LEVELS;  Surgeon: Tia Alert, MD;  Location: [facility];  Service: Neurosurgery;  Laterality: N/A;  ANTERIOR CERVICAL DECOMPRESSION/DISCECTOMY FUSION 4 LEVELS CERVICAL 3-7    There were no vitals filed for this visit.  Visit Diagnosis:  Right knee pain  Stiffness of knee joint, right  Difficulty walking  Weakness of right leg  Abnormality of gait  Left-sided low back pain with left-sided sciatica  Left hip pain      Subjective Assessment - 10/17/15 1446    Subjective L hip pain; R knee feels great.   Patient Stated Goals "To make sure I'm improving my degrees and keeping the swelling down"   Multiple Pain Sites Yes   Pain Score 3   Pain Location Hip   Pain Orientation Left   Pain Descriptors / Indicators Aching   Pain Type Chronic pain   Pain  Onset 1 to 4 weeks ago   Pain Frequency Intermittent                         [facility] Adult PT Treatment/Exercise - 10/17/15 1449    Exercises   Exercises Lumbar   Lumbar Exercises: Stretches   Passive Hamstring Stretch 3 reps;20 seconds   Passive Hamstring Stretch Limitations supine with strap   Single Knee to Chest Stretch 3 reps;20 seconds   Double Knee to Chest Stretch 3 reps;20 seconds   Piriformis Stretch 3 reps;20 seconds   Piriformis Stretch Limitations supine figure 4   Lumbar Exercises: Seated   Sit to Stand 10 reps   Lumbar Exercises: Supine   Clam 10 reps   Clam Limitations single limb with green theraband   Bridge 10 reps;5 seconds   Lumbar Exercises: Sidelying   Hip Abduction 10 reps   Knee/Hip Exercises: Aerobic   Nustep L6 x 8 min                PT Education - 10/17/15 1533    Education provided Yes   Education Details HEP for stretching and hip strengthening   Person(s) Educated Patient   Methods Explanation;Demonstration;Handout   Comprehension Verbalized understanding;Returned demonstration;Need further instruction  PT Long Term Goals - 10/10/15 1552    PT LONG TERM GOAL #1   Title Independent with HEP (12/216)   Time 5   Period Weeks   Status On-going   PT LONG TERM GOAL #2   Title Patient will ambulate without cane demonstrating normal gait pattern    Baseline has baseline deviations   Status Achieved   PT LONG TERM GOAL #3   Title Patient will ascend/descend stairs reciprocally with normal step pattern (11/10/15)   Status On-going   PT LONG TERM GOAL #4   Title Right knee AROM 0-125 or greater without pain for functional gait and stairs climbing (10/09/15)   Status Achieved   PT LONG TERM GOAL #5   Title Right knee strength 4+/5 or greater for iimproved stability while ambulating (10/09/15)   Status Achieved   Additional Long Term Goals   Additional Long Term Goals Yes   PT LONG TERM GOAL #6   Title  improve bil hip strength to at least 4+/5 for improved stability and function (11/10/15)   Time 5   Period Weeks   Status New               Plan - 10/17/15 1535    Clinical Impression Statement Pt tolerated exercises well and updated home program to address hip weakness.  Pt will continue to benefit from PT to maximize function.   PT Next Visit Plan continue hip strengthening/stretching   Consulted and Agree with Plan of Care Patient        Problem List Patient Active Problem List   Diagnosis Date Noted  . S/P cervical spinal fusion 02/08/2015   Clarita CraneStephanie F Banessa Mao, PT, DPT 10/17/2015 3:37 PM  [facility] [address]  [address] [place], [place], [address] Phone: [phone]   Fax:  [phone]  Name: Monica Savage MRN: [id number] Date of Birth: August 15, 1951

## 2015-10-17 NOTE — Patient Instructions (Signed)
Knee to Chest    Lying supine, bend involved knee to chest _2-3__ times.  Hold 20-30 seconds. Repeat with other leg. Do _1-2__ times per day.  Copyright  VHI. All rights reserved.   Knee-to-Chest Stretch: Bilateral    With hands behind knees, pull both knees in to chest until a comfortable stretch is felt in lower back and buttocks. Keep back relaxed. Hold __20-30__ seconds. Repeat __3__ times per set. Do _1___ sets per session. Do _1-2___ sessions per day.  http://orth.exer.us/129   Copyright  VHI. All rights reserved.   Hamstring Step 1    Straighten left knee. Keep knee level with other knee or on bolster. Hold _20-30__ seconds. Relax knee by returning foot to start. Repeat _2-3__ times.  Copyright  VHI. All rights reserved.   Piriformis Stretch, Supine    Lie supine, one ankle crossed onto opposite knee. Holding bottom leg behind knee, gently pull legs toward chest until stretch is felt in buttock of top leg. Hold _20-30__ seconds. For deeper stretch gently push top knee away from body.  Repeat _3__ times per session. Do _1-2__ sessions per day.  Copyright  VHI. All rights reserved.   Bridging    Slowly raise buttocks from floor, keeping stomach tight. Repeat __10__ times per set. Do __1__ sets per session. Do _1-2___ sessions per day.  http://orth.exer.us/1097   Copyright  VHI. All rights reserved.   Abduction    Lift leg up toward ceiling. Return. Repeat __10__ times each leg. Do __1-2__ sessions per day.  http://gt2.exer.us/386   Copyright  VHI. All rights reserved.   Functional Quadriceps: Sit to Stand    Sit on edge of chair, feet flat on floor. Stand upright, extending knees fully. Repeat _10___ times per set. Do __1__ sets per session. Do _1-2___ sessions per day.  http://orth.exer.us/735   Copyright  VHI. All rights reserved.    [facility] [address] [place],  [place] [address]  [phone] (office) [phone] (fax)

## 2015-10-19 ENCOUNTER — Ambulatory Visit: Payer: Managed Care, Other (non HMO)

## 2015-10-24 ENCOUNTER — Ambulatory Visit: Payer: Managed Care, Other (non HMO) | Admitting: Physical Therapy

## 2015-10-24 DIAGNOSIS — M25561 Pain in right knee: Secondary | ICD-10-CM

## 2015-10-24 DIAGNOSIS — R29898 Other symptoms and signs involving the musculoskeletal system: Secondary | ICD-10-CM

## 2015-10-24 DIAGNOSIS — M25661 Stiffness of right knee, not elsewhere classified: Secondary | ICD-10-CM

## 2015-10-24 DIAGNOSIS — R262 Difficulty in walking, not elsewhere classified: Secondary | ICD-10-CM

## 2015-10-24 NOTE — Therapy (Signed)
[facility] [place] [address]  [address] [place], [place], [address] Phone: [phone]   Fax:  [phone]  Physical Therapy Treatment  Patient Details  Name: Monica Savage MRN: [id number] Date of Birth: 1951-11-24 No Data Recorded  Encounter Date: 10/24/2015      PT End of Session - 10/24/15 1415    Visit Number 10   Number of Visits 16   Date for PT Re-Evaluation 11/10/15   PT Start Time 1402   PT Stop Time 1446   PT Time Calculation (min) 44 min   Activity Tolerance Patient tolerated treatment well   Behavior During Therapy [facility] for tasks assessed/performed      Past Medical History  Diagnosis Date  . Hypertension   . COPD (chronic obstructive pulmonary disease)     chronic bronchitis  . Depression   . Anxiety   . Scoliosis     Past Surgical History  Procedure Laterality Date  . Total hip arthroplasty Right 2008  . Back surgery  2010    lower  . Total hip arthroplasty Left 2012  . Knee surgery Right 2013  . Anterior cervical decompression/discectomy fusion 4 levels N/A 02/08/2015    Procedure: ANTERIOR CERVICAL DECOMPRESSION/DISCECTOMY FUSION 4 LEVELS;  Surgeon: Tia Alertavid S Jones, MD;  Location: [facility];  Service: Neurosurgery;  Laterality: N/A;  ANTERIOR CERVICAL DECOMPRESSION/DISCECTOMY FUSION 4 LEVELS CERVICAL 3-7    There were no vitals filed for this visit.  Visit Diagnosis:  Right knee pain  Stiffness of knee joint, right  Difficulty walking  Weakness of right leg      Subjective Assessment - 10/24/15 1406    Subjective Patient reporting more pain at rest than with movement esp with valgus motion when trying to lift leg up into sidelying on sofa.   Currently in Pain? Yes   Pain Score 5    Pain Location Knee   Pain Orientation Right            [facility] PT Assessment - 10/24/15 1402    Observation/Other Assessments   Focus on Therapeutic Outcomes (FOTO)  47% (53% limitation); Predicted 54%  (46% limitation)   ROM / Strength   AROM / PROM / Strength AROM;Strength   AROM   AROM Assessment Site Knee   Right/Left Knee Right   Right Knee Extension 0   Right Knee Flexion 130   Strength   Strength Assessment Site Hip;Knee   Right/Left Hip Right   Right Hip Flexion 4+/5   Right Hip Extension 4-/5   Right Hip ABduction 4/5   Right Hip ADduction 4/5   Right/Left Knee Right   Right Knee Flexion 4+/5   Right Knee Extension 4+/5                [facility] Adult PT Treatment/Exercise - 10/24/15 1402    Exercises   Exercises Knee/Hip   Knee/Hip Exercises: Aerobic   Stationary Bike L 2 x 6'   Knee/Hip Exercises: Machines for Strengthening   Cybex Knee Extension DL 40#20# H47x15   Cybex Knee Flexion DL 42#25# V95x15   Cybex Leg Press DL 63#25# O75x15, 64#35# P32x10   Knee/Hip Exercises: Standing   Side Lunges Both;10 reps;3 seconds   Side Lunges Limitations TRX   Functional Squat 15 reps;3 seconds;2 sets   Functional Squat Limitations TRX, 2nd set with heel raise upon rising   Wall Squat 10 reps;3 seconds;2 sets   Wall Squat Limitations 2nd set with ball btw  knees   Walking with Sports Cord sidestepping with green TB around knees 49ft x2, bilaterally                     PT Long Term Goals - 10/24/15 1604    PT LONG TERM GOAL #1   Title Independent with HEP (12/216)   Status On-going   PT LONG TERM GOAL #2   Title Patient will ambulate without cane demonstrating normal gait pattern    Status Achieved   PT LONG TERM GOAL #3   Title Patient will ascend/descend stairs reciprocally with normal step pattern (11/10/15)   Status On-going   PT LONG TERM GOAL #4   Title Right knee AROM 0-125 or greater without pain for functional gait and stairs climbing (10/09/15)   Status Achieved   PT LONG TERM GOAL #5   Title Right knee strength 4+/5 or greater for iimproved stability while ambulating (10/09/15)   Status Achieved   PT LONG TERM GOAL #6   Title improve bil hip strength to at  least 4+/5 for improved stability and function (11/10/15)   Status On-going               Plan - 10/24/15 1540    Clinical Impression Statement Patient reporting intermittent lateral knee pain when lifting leg using hip abduction and IR in sidelying to place leg on sofa but otherwise typically no pain. Hip pain resolving with hip strength returning although still weakest in right hip extension. Tolerance for therapy limited today due to SOB from COPD requiring more frequent rest breaks.   PT Next Visit Plan continue hip strengthening/stretching   Consulted and Agree with Plan of Care Patient        Problem List Patient Active Problem List   Diagnosis Date Noted  . S/P cervical spinal fusion 02/08/2015    Marry Guan, PT, MPT 10/24/2015, 4:37 PM  [facility] [address]  [address] [place], [place], [address] Phone: [phone]   Fax:  [phone]  Name: Monica Savage MRN: [id number] Date of Birth: 06-23-1951

## 2015-10-26 ENCOUNTER — Ambulatory Visit: Payer: Managed Care, Other (non HMO) | Admitting: Physical Therapy

## 2015-10-26 DIAGNOSIS — M25552 Pain in left hip: Secondary | ICD-10-CM

## 2015-10-26 DIAGNOSIS — M25561 Pain in right knee: Secondary | ICD-10-CM | POA: Diagnosis not present

## 2015-10-26 DIAGNOSIS — R269 Unspecified abnormalities of gait and mobility: Secondary | ICD-10-CM

## 2015-10-26 DIAGNOSIS — R262 Difficulty in walking, not elsewhere classified: Secondary | ICD-10-CM

## 2015-10-26 NOTE — Therapy (Addendum)
[facility] [address]  [address] [place], [place], [address] Phone: [phone]   Fax:  [phone]  Physical Therapy Treatment  Patient Details  Name: MONIKA CHESTANG MRN: [id number] Date of Birth: Sep 30, 1951 No Data Recorded  Encounter Date: 10/26/2015      PT End of Session - 10/26/15 1414    Visit Number 11   Number of Visits 16   Date for PT Re-Evaluation 11/10/15   PT Start Time 1405   PT Stop Time 1445   PT Time Calculation (min) 40 min   Activity Tolerance Patient tolerated treatment well   Behavior During Therapy [facility] for tasks assessed/performed      Past Medical History  Diagnosis Date  . Hypertension   . COPD (chronic obstructive pulmonary disease)     chronic bronchitis  . Depression   . Anxiety   . Scoliosis     Past Surgical History  Procedure Laterality Date  . Total hip arthroplasty Right 2008  . Back surgery  2010    lower  . Total hip arthroplasty Left 2012  . Knee surgery Right 2013  . Anterior cervical decompression/discectomy fusion 4 levels N/A 02/08/2015    Procedure: ANTERIOR CERVICAL DECOMPRESSION/DISCECTOMY FUSION 4 LEVELS;  Surgeon: Tia Alert, MD;  Location: [facility];  Service: Neurosurgery;  Laterality: N/A;  ANTERIOR CERVICAL DECOMPRESSION/DISCECTOMY FUSION 4 LEVELS CERVICAL 3-7    There were no vitals filed for this visit.  Visit Diagnosis:  Right knee pain  Left hip pain  Difficulty walking  Abnormality of gait      Subjective Assessment - 10/26/15 1413    Subjective Patient reporting increased post work-out soreness after last visit but no increased pain. Patient also reporting adding several core exercises on own from proevous work-out she use to complete as well as exercises for scaitica she learned from a TV program.   Currently in Pain? Yes   Pain Score 3    Pain Location Knee   Pain Orientation Right   Pain Score 3   Pain Location Hip   Pain  Orientation Left                [facility] Adult PT Treatment/Exercise - 10/26/15 1405    Ambulation/Gait   Assistive device None   Gait Pattern Lateral hip instability  bilateral hip interanal rotation and foot pronation   Stair Management Technique One rail Right;Alternating pattern   Gait Comments unable to perform stair descent without demonstrating instability of bil knees/hips   Exercises   Exercises Knee/Hip   Lumbar Exercises: Stretches   Passive Hamstring Stretch 3 reps;20 seconds   Passive Hamstring Stretch Limitations supine with strap   Single Knee to Chest Stretch 3 reps;20 seconds   Double Knee to Chest Stretch 3 reps;20 seconds   Quad Stretch 3 reps;20 seconds   Quad Stretch Limitations prone with hip extended to target rectus femoris   Piriformis Stretch 3 reps;20 seconds   Piriformis Stretch Limitations supine figure 4   Lumbar Exercises: Supine   Other Supine Lumbar Exercises Sciatic nerve glide x20   Knee/Hip Exercises: Aerobic   Stationary Bike L 3 x 6'                 PT Long Term Goals - 10/24/15 1604    PT LONG TERM GOAL #1   Title Independent with HEP (12/216)   Status On-going   PT LONG TERM GOAL #2   Title  Patient will ambulate without cane demonstrating normal gait pattern    Status Achieved   PT LONG TERM GOAL #3   Title Patient will ascend/descend stairs reciprocally with normal step pattern (11/10/15)   Status On-going   PT LONG TERM GOAL #4   Title Right knee AROM 0-125 or greater without pain for functional gait and stairs climbing (10/09/15)   Status Achieved   PT LONG TERM GOAL #5   Title Right knee strength 4+/5 or greater for iimproved stability while ambulating (10/09/15)   Status Achieved   PT LONG TERM GOAL #6   Title improve bil hip strength to at least 4+/5 for improved stability and function (11/10/15)   Status On-going               Plan - 10/26/15 1455    Clinical Impression Statement Pain present in both  right knee and left hip today potentially secondary to overuse from increased exercises initiated by patient on own at home. Cautioned patient regarding too much exercise creating increased irritation and encouraged patient to focus on PT HEP at present. Reassessed gait today as patient continuing to complain of feeling "pigeon-toed and flat-footed" while walking which creates increased lateral instability. Discussed potential orthotics to improve LE alignment and normalize gait pattern but patient reluctant stating she only wears shoes when going out and never wears shoes at home.   PT Next Visit Plan continue hip strengthening/stretching   Consulted and Agree with Plan of Care Patient        Problem List Patient Active Problem List   Diagnosis Date Noted  . S/P cervical spinal fusion 02/08/2015    Marry GuanJoAnne M Kreis, PT, MPT 10/26/2015, 6:02 PM  [facility] [place] [address]  [address] [place], [place], [address] Phone: [phone]   Fax:  [phone]  Name: Zella RicherLinda J Zylstra MRN: [id number] Date of Birth: [birth year]

## 2015-10-31 ENCOUNTER — Ambulatory Visit: Payer: Managed Care, Other (non HMO) | Admitting: Physical Therapy

## 2015-10-31 DIAGNOSIS — R29898 Other symptoms and signs involving the musculoskeletal system: Secondary | ICD-10-CM

## 2015-10-31 DIAGNOSIS — M25561 Pain in right knee: Secondary | ICD-10-CM | POA: Diagnosis not present

## 2015-10-31 DIAGNOSIS — R262 Difficulty in walking, not elsewhere classified: Secondary | ICD-10-CM

## 2015-10-31 DIAGNOSIS — M25552 Pain in left hip: Secondary | ICD-10-CM

## 2015-10-31 DIAGNOSIS — R269 Unspecified abnormalities of gait and mobility: Secondary | ICD-10-CM

## 2015-11-01 NOTE — Therapy (Addendum)
[facility] [address]  [place], [place], [address] Phone: [phone]   Fax:  [phone]  Physical Therapy Treatment  Patient Details  Name: Monica Savage MRN: [id number] Date of Birth: 09/06/1951 Referring provider: Einar Crow, MD  Encounter Date: 10/31/2015      PT End of Session - 10/31/15 1418    Visit Number 12   Number of Visits 16   Date for PT Re-Evaluation 11/10/15   PT Start Time 1401   PT Stop Time 1451   PT Time Calculation (min) 50 min   Activity Tolerance Patient tolerated treatment well;Patient limited by pain   Behavior During Therapy [facility] for tasks assessed/performed      Past Medical History  Diagnosis Date  . Hypertension   . COPD (chronic obstructive pulmonary disease)     chronic bronchitis  . Depression   . Anxiety   . Scoliosis     Past Surgical History  Procedure Laterality Date  . Total hip arthroplasty Right 2008  . Back surgery  2010    lower  . Total hip arthroplasty Left 2012  . Knee surgery Right 2013  . Anterior cervical decompression/discectomy fusion 4 levels N/A 02/08/2015    Procedure: ANTERIOR CERVICAL DECOMPRESSION/DISCECTOMY FUSION 4 LEVELS;  Surgeon: Eustace Moore, MD;  Location: [facility];  Service: Neurosurgery;  Laterality: N/A;  ANTERIOR CERVICAL DECOMPRESSION/DISCECTOMY FUSION 4 LEVELS CERVICAL 3-7    There were no vitals filed for this visit.  Visit Diagnosis:  Right knee pain  Difficulty walking  Abnormality of gait  Weakness of right leg  Left hip pain      Subjective Assessment - 10/31/15 1408    Subjective Patient reported episode on sudden onset of sharp intense pain in lateral knee while turning when walking into the kitchen on Sunday night. Pain lasted several minutes and was associated with nausea, stomach distress/diarrhea and "near blackout". Otherwise pain typically mostly only an issue at night, waking her up frequently  throughout the night. Continues to report completing abdominal and LE exercises x 30-50 reps.   Currently in Pain? Yes   Pain Score 3    Pain Location Knee   Pain Orientation Right;Lateral                 [facility] Adult PT Treatment/Exercise - 10/31/15 1401    Exercises   Exercises Knee/Hip;Lumbar   Lumbar Exercises: Stretches   Passive Hamstring Stretch 3 reps;20 seconds   Passive Hamstring Stretch Limitations supine with strap   Single Knee to Chest Stretch 3 reps;20 seconds   Quad Stretch 3 reps;20 seconds   Quad Stretch Limitations prone with hip extended to target rectus femoris   ITB Stretch 3 reps;20 seconds   ITB Stretch Limitations manual   Piriformis Stretch 3 reps;20 seconds   Piriformis Stretch Limitations supine figure 4   Lumbar Exercises: Supine   Other Supine Lumbar Exercises Sciatic nerve glide x20   Knee/Hip Exercises: Aerobic   Recumbent Bike L 3 x 6'   Manual Therapy   Manual Therapy Soft tissue mobilization;Taping   Soft tissue mobilization STM to right ITB in left sidelying   Kinesiotex Inhibit Muscle   Kinesiotix   Inhibit Muscle  Right ITB - TFL to ITB insertion at 30% stretch with star over ITB insertion                PT Education - 10/31/15 1440    Education provided Yes  Education Details Purpose of taping and wearing schedule   Person(s) Educated Patient   Methods Explanation   Comprehension Verbalized understanding             PT Long Term Goals - 10/31/15 1450    PT LONG TERM GOAL #1   Title Independent with HEP (12/216)   Status On-going   PT LONG TERM GOAL #2   Title Patient will ambulate without cane demonstrating normal gait pattern    Status Achieved   PT LONG TERM GOAL #3   Title Patient will ascend/descend stairs reciprocally with normal step pattern (11/10/15)   Status On-going   PT LONG TERM GOAL #4   Title Right knee AROM 0-125 or greater without pain for functional gait and stairs climbing (10/09/15)    Status Achieved   PT LONG TERM GOAL #5   Title Right knee strength 4+/5 or greater for iimproved stability while ambulating (10/09/15)   Status Achieved   PT LONG TERM GOAL #6   Title improve bil hip strength to at least 4+/5 for improved stability and function (11/10/15)   Status On-going               Plan - 10/31/15 1848    Clinical Impression Statement Patient continuing to c/o distal-lateral right knee pain localized in area of ITB insertion with lateral and peripatellar swelling noted. Irritation most likely to overuse from excessive hip exercises which patient initiated on own independent from PT HEP. Patient previously cautioned to focus on PT prescribed HEP but continues to report completing her own exercises up to 30-50 reps per day. Reviewed education in negative effects of overuse and instructed patient to defer her any exercises not prescribed as part of HEP. Initiated trial of taping to reduce strain on ITB to reduce irritation and inflammation. Will assess response at next visit and consider ionto if pain/irritation persists. Therapy focus today primarily on stretching and manual therapy due to increased irritation.   PT Next Visit Plan Assess response to taping; trial of iontophoresis if pain/irritation persists; core/hip/knee flexibility and strengthening   Consulted and Agree with Plan of Care Patient        Problem List Patient Active Problem List   Diagnosis Date Noted  . S/P cervical spinal fusion 02/08/2015    Percival Spanish, PT, MPT 11/01/2015, 8:26 AM  [facility] [address]  [place], [place], [address] Phone: [phone]   Fax:  [phone]  Name: Monica Savage MRN: [id number] Date of Birth: October 23, 1951    PHYSICAL THERAPY DISCHARGE SUMMARY  Visits from Start of Care: 12  Current functional level related to goals / functional outcomes:   Unable to fully assess status as  discharge as patient failed to return for further PT visits. As of last measurement, right knee ROM 0-130. Right LE strength: hip flexion 4+/5, extension 4-/5, abduction & adduction 4/5; knee flexion & extension 4+/5. As of last PT visit, patient able to demonstrate normal gait pattern, but stairs not recently assessed.  Right knee ROM & strength, and gait goals met; hip strength goal partially met; and remaining goals unable to assess secondary to failure to return to PT.   Remaining deficits:   As of last visit, patient continuing to c/o distal-lateral right knee pain localized in area of ITB insertion with lateral and peripatellar swelling noted. Irritation most likely to overuse from excessive hip exercises which patient initiated on own independent from PT  HEP. Patient previously cautioned to focus on PT prescribed HEP but continues to report completing her own exercises up to 30-50 reps per day. Reviewed education in negative effects of overuse and instructed patient to defer her any exercises not prescribed as part of HEP. Initiated trial of taping to reduce strain on ITB to reduce irritation and inflammation, but unable to assess response secondary to failure to return to PT.   Education / Equipment:   HEP  Plan: Patient agrees to discharge.  Patient goals were partially met. Patient is being discharged due to not returning since the last visit.  ?????       Percival Spanish, PT, MPT 12/05/2015, 9:28 AM  [facility] [address]  [place], [place], [address] Phone: [phone]   Fax:  [phone]

## 2015-11-07 ENCOUNTER — Ambulatory Visit: Payer: Managed Care, Other (non HMO) | Admitting: Physical Therapy

## 2015-11-09 ENCOUNTER — Ambulatory Visit: Payer: Managed Care, Other (non HMO) | Admitting: Physical Therapy

## 2016-07-01 IMAGING — CR DG CERVICAL SPINE 1V
1 series · 1 of 1 positions shown · non-contrast
Comparison: Intraoperative C-arm spot films of 02/08/2015

CLINICAL DATA: Neck pain, surgery in Saturday February, 2015

EXAM:
DG CERVICAL SPINE - 1 VIEW

[c-spine lat]
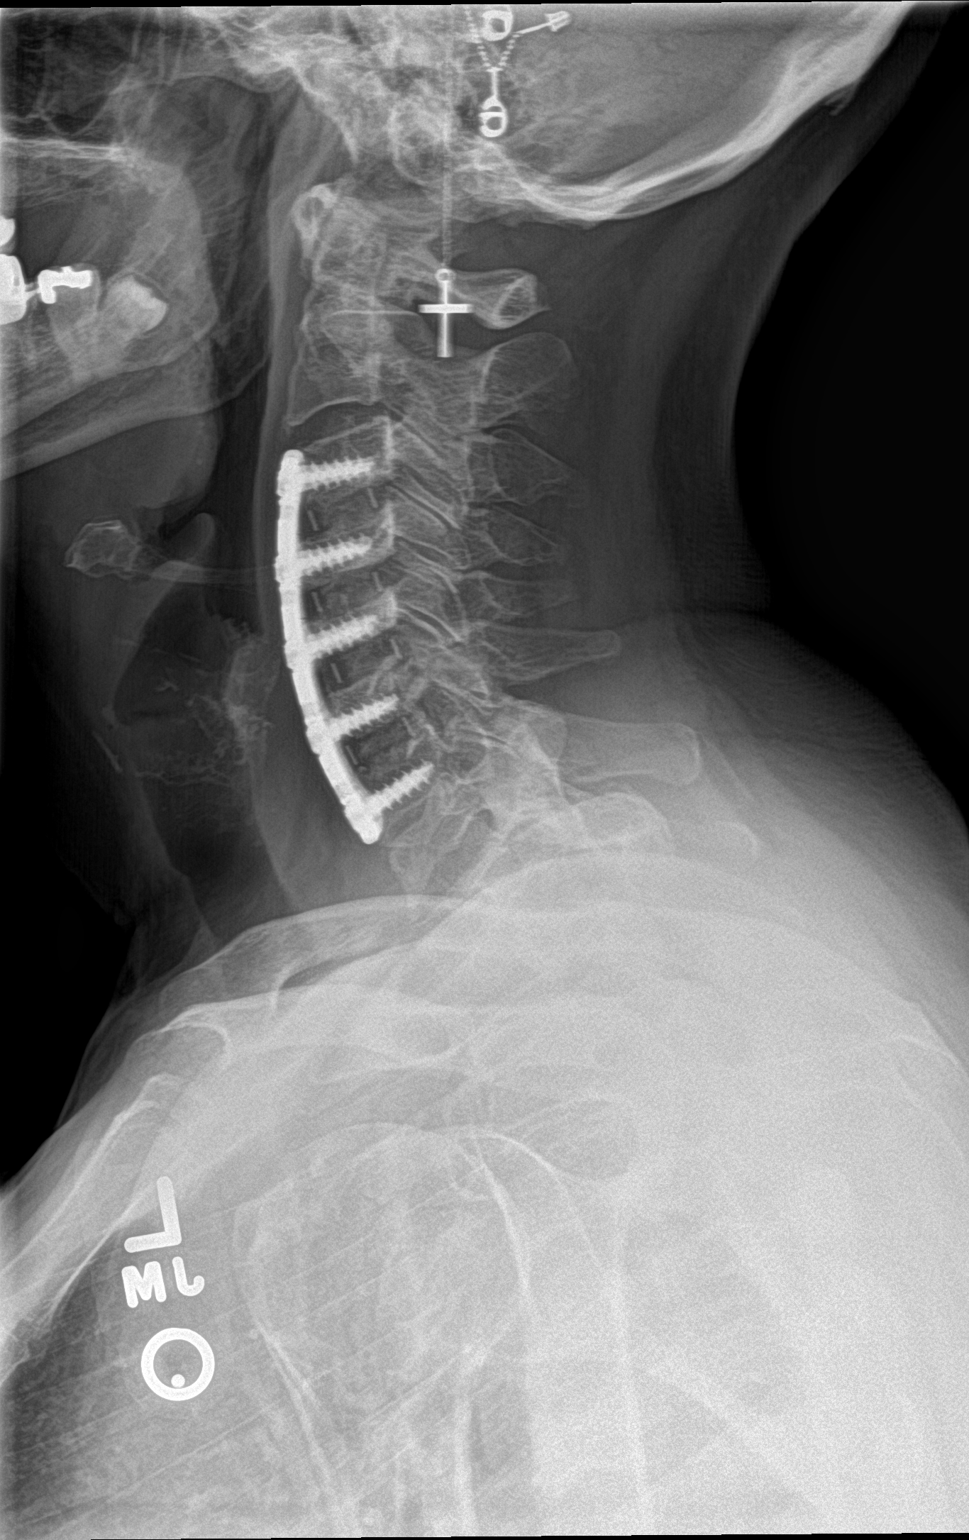

[1 of 1 positions shown; findings below may reference images not displayed]

FINDINGS: The anterior metallic fixation plate from C3-C7 is unchanged in
position. Interbody fusion plugs appear to be in good position.
Normal alignment is maintained. No definite prevertebral soft tissue
swelling is noted.
IMPRESSION: Stable anterior fusion from C3-C7. No complicating features are
evident.

## 2016-08-26 IMAGING — CR DG CERVICAL SPINE 1V
1 series · 1 of 1 positions shown · non-contrast
Comparison: 03/28/2015.

CLINICAL DATA: Cervical spine fusion.

EXAM:
DG CERVICAL SPINE - 1 VIEW

[c-spine lat]
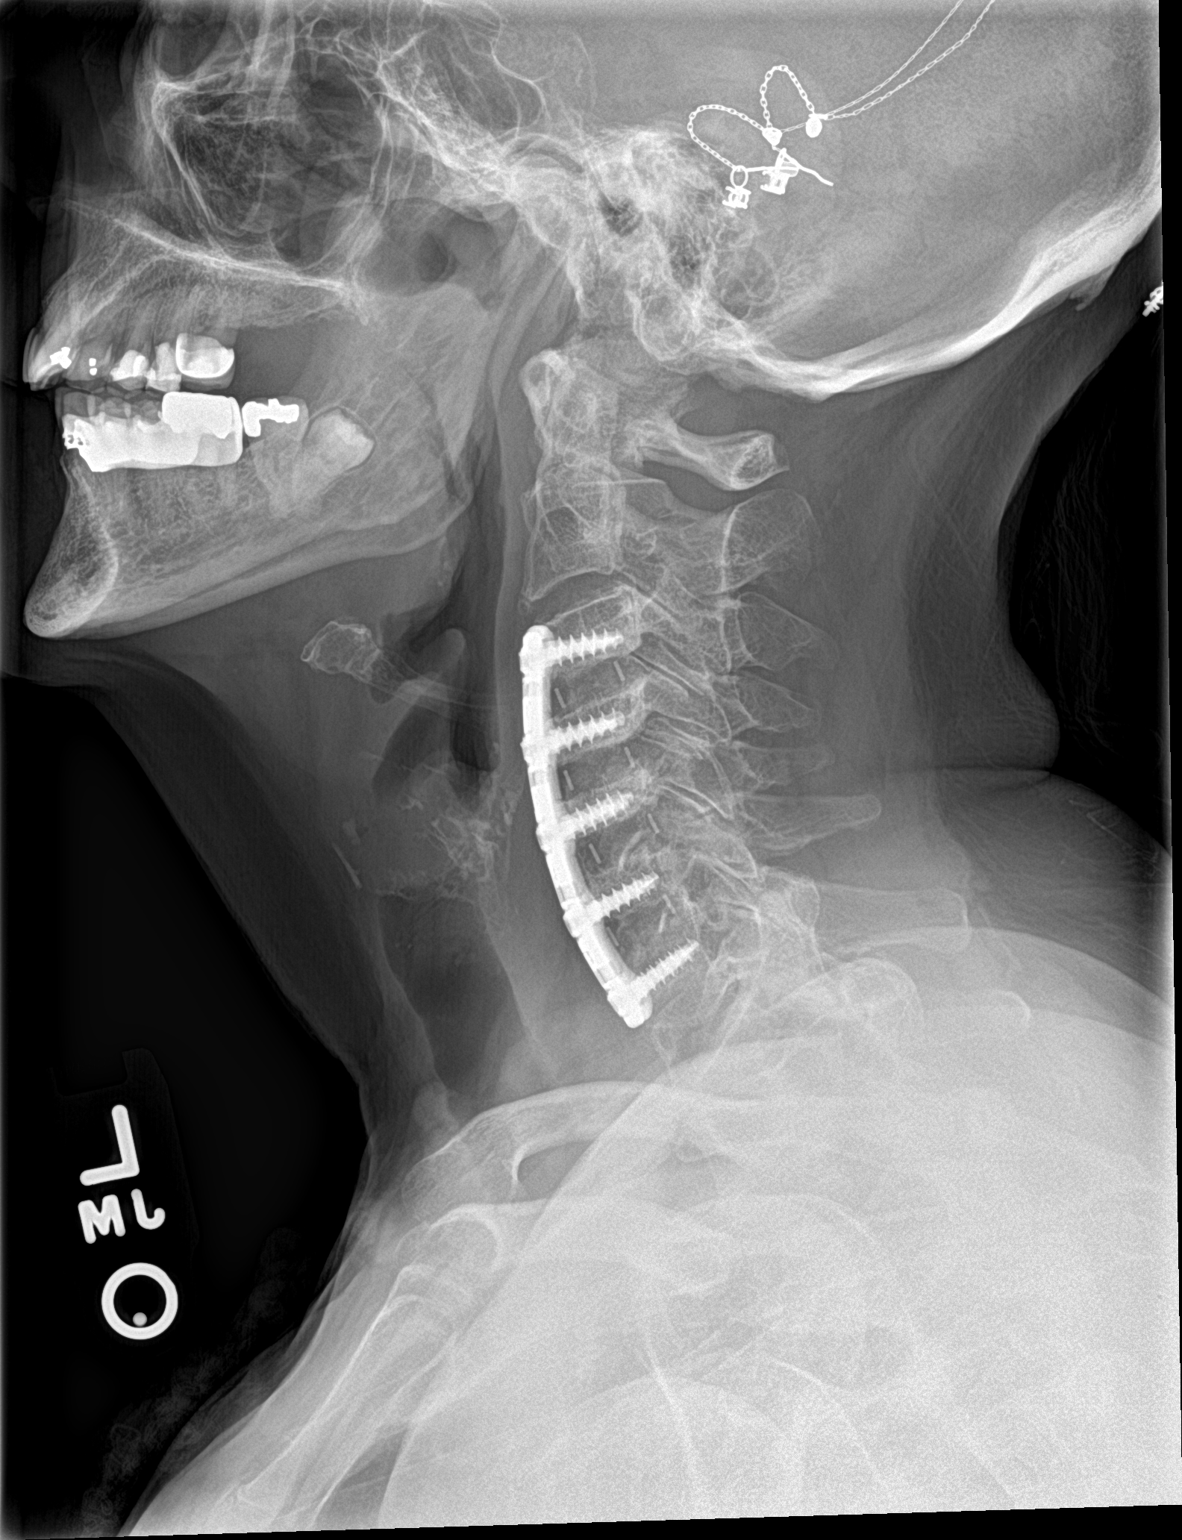

[1 of 1 positions shown; findings below may reference images not displayed]

FINDINGS: Prior C3 through C7 anterior and interbody fusion with good anatomic
alignment. Hardware intact. Diffuse osteopenia. Diffuse degenerative
change. No acute abnormality.
IMPRESSION: C3 through C7 anterior and interbody fusion with good anatomic
alignment.

## 2017-05-13 DIAGNOSIS — Z8619 Personal history of other infectious and parasitic diseases: Secondary | ICD-10-CM | POA: Diagnosis not present

## 2017-05-13 DIAGNOSIS — R159 Full incontinence of feces: Secondary | ICD-10-CM | POA: Diagnosis not present

## 2017-05-13 DIAGNOSIS — K921 Melena: Secondary | ICD-10-CM | POA: Diagnosis not present

## 2017-05-13 DIAGNOSIS — Z86010 Personal history of colonic polyps: Secondary | ICD-10-CM | POA: Diagnosis not present

## 2017-05-13 DIAGNOSIS — R197 Diarrhea, unspecified: Secondary | ICD-10-CM | POA: Diagnosis not present

## 2017-06-17 DIAGNOSIS — I10 Essential (primary) hypertension: Secondary | ICD-10-CM | POA: Diagnosis not present

## 2017-06-17 DIAGNOSIS — Z6828 Body mass index (BMI) 28.0-28.9, adult: Secondary | ICD-10-CM | POA: Diagnosis not present

## 2017-06-17 DIAGNOSIS — M461 Sacroiliitis, not elsewhere classified: Secondary | ICD-10-CM | POA: Diagnosis not present

## 2017-07-09 DIAGNOSIS — M47816 Spondylosis without myelopathy or radiculopathy, lumbar region: Secondary | ICD-10-CM | POA: Diagnosis not present

## 2017-07-29 DIAGNOSIS — M545 Low back pain: Secondary | ICD-10-CM | POA: Diagnosis not present

## 2017-07-29 DIAGNOSIS — M47816 Spondylosis without myelopathy or radiculopathy, lumbar region: Secondary | ICD-10-CM | POA: Diagnosis not present

## 2017-07-29 DIAGNOSIS — Z6828 Body mass index (BMI) 28.0-28.9, adult: Secondary | ICD-10-CM | POA: Diagnosis not present

## 2017-07-29 DIAGNOSIS — I10 Essential (primary) hypertension: Secondary | ICD-10-CM | POA: Diagnosis not present

## 2017-09-02 DIAGNOSIS — Z6828 Body mass index (BMI) 28.0-28.9, adult: Secondary | ICD-10-CM | POA: Diagnosis not present

## 2017-09-02 DIAGNOSIS — I10 Essential (primary) hypertension: Secondary | ICD-10-CM | POA: Diagnosis not present

## 2017-09-02 DIAGNOSIS — M47816 Spondylosis without myelopathy or radiculopathy, lumbar region: Secondary | ICD-10-CM | POA: Diagnosis not present

## 2017-10-06 DIAGNOSIS — M47816 Spondylosis without myelopathy or radiculopathy, lumbar region: Secondary | ICD-10-CM | POA: Diagnosis not present

## 2017-10-08 DIAGNOSIS — I10 Essential (primary) hypertension: Secondary | ICD-10-CM | POA: Diagnosis not present

## 2017-10-08 DIAGNOSIS — M47816 Spondylosis without myelopathy or radiculopathy, lumbar region: Secondary | ICD-10-CM | POA: Diagnosis not present

## 2017-11-21 DIAGNOSIS — Z1231 Encounter for screening mammogram for malignant neoplasm of breast: Secondary | ICD-10-CM | POA: Diagnosis not present

## 2017-11-26 DIAGNOSIS — I10 Essential (primary) hypertension: Secondary | ICD-10-CM | POA: Diagnosis not present

## 2017-11-26 DIAGNOSIS — Z6828 Body mass index (BMI) 28.0-28.9, adult: Secondary | ICD-10-CM | POA: Diagnosis not present

## 2017-11-26 DIAGNOSIS — M461 Sacroiliitis, not elsewhere classified: Secondary | ICD-10-CM | POA: Diagnosis not present
